# Patient Record
Sex: Female | Born: 1965 | Race: White | Hispanic: No | Marital: Single | Smoking: Current every day smoker
Health system: Southern US, Community
[De-identification: ages and names within clinical notes are randomized; demographics above are authoritative.]

## PROBLEM LIST (undated history)

## (undated) DIAGNOSIS — E785 Hyperlipidemia, unspecified: Secondary | ICD-10-CM

## (undated) DIAGNOSIS — I10 Essential (primary) hypertension: Secondary | ICD-10-CM

## (undated) DIAGNOSIS — F1910 Other psychoactive substance abuse, uncomplicated: Secondary | ICD-10-CM

## (undated) DIAGNOSIS — F909 Attention-deficit hyperactivity disorder, unspecified type: Secondary | ICD-10-CM

## (undated) DIAGNOSIS — K219 Gastro-esophageal reflux disease without esophagitis: Secondary | ICD-10-CM

## (undated) HISTORY — PX: BREAST BIOPSY: SHX20

## (undated) HISTORY — DX: Gastro-esophageal reflux disease without esophagitis: K21.9

## (undated) HISTORY — DX: Hyperlipidemia, unspecified: E78.5

## (undated) HISTORY — DX: Other psychoactive substance abuse, uncomplicated: F19.10

## (undated) HISTORY — PX: ANKLE SURGERY: SHX546

## (undated) HISTORY — DX: Essential (primary) hypertension: I10

## (undated) HISTORY — DX: Attention-deficit hyperactivity disorder, unspecified type: F90.9

## (undated) HISTORY — PX: TUBAL LIGATION: SHX77

---

## 1996-06-13 HISTORY — PX: BREAST EXCISIONAL BIOPSY: SUR124

## 2010-05-02 ENCOUNTER — Emergency Department (HOSPITAL_COMMUNITY)
Admission: EM | Admit: 2010-05-02 | Discharge: 2010-05-02 | Payer: Self-pay | Source: Home / Self Care | Admitting: Emergency Medicine

## 2010-05-02 ENCOUNTER — Encounter (INDEPENDENT_AMBULATORY_CARE_PROVIDER_SITE_OTHER): Payer: Self-pay | Admitting: *Deleted

## 2010-05-14 ENCOUNTER — Encounter (INDEPENDENT_AMBULATORY_CARE_PROVIDER_SITE_OTHER): Payer: Self-pay | Admitting: *Deleted

## 2010-06-25 ENCOUNTER — Encounter (INDEPENDENT_AMBULATORY_CARE_PROVIDER_SITE_OTHER): Payer: Self-pay | Admitting: *Deleted

## 2010-06-25 ENCOUNTER — Ambulatory Visit
Admission: RE | Admit: 2010-06-25 | Discharge: 2010-06-25 | Payer: Self-pay | Source: Home / Self Care | Attending: Gastroenterology | Admitting: Gastroenterology

## 2010-06-25 DIAGNOSIS — R630 Anorexia: Secondary | ICD-10-CM | POA: Insufficient documentation

## 2010-06-25 DIAGNOSIS — R112 Nausea with vomiting, unspecified: Secondary | ICD-10-CM | POA: Insufficient documentation

## 2010-06-25 DIAGNOSIS — R109 Unspecified abdominal pain: Secondary | ICD-10-CM | POA: Insufficient documentation

## 2010-06-25 DIAGNOSIS — R141 Gas pain: Secondary | ICD-10-CM | POA: Insufficient documentation

## 2010-06-25 DIAGNOSIS — R1013 Epigastric pain: Secondary | ICD-10-CM | POA: Insufficient documentation

## 2010-06-25 DIAGNOSIS — R143 Flatulence: Secondary | ICD-10-CM

## 2010-06-25 DIAGNOSIS — R142 Eructation: Secondary | ICD-10-CM

## 2010-06-25 DIAGNOSIS — K5900 Constipation, unspecified: Secondary | ICD-10-CM | POA: Insufficient documentation

## 2010-06-25 DIAGNOSIS — K219 Gastro-esophageal reflux disease without esophagitis: Secondary | ICD-10-CM | POA: Insufficient documentation

## 2010-07-07 ENCOUNTER — Ambulatory Visit (HOSPITAL_COMMUNITY)
Admission: RE | Admit: 2010-07-07 | Discharge: 2010-07-07 | Payer: Self-pay | Source: Home / Self Care | Attending: Gastroenterology | Admitting: Gastroenterology

## 2010-07-07 ENCOUNTER — Ambulatory Visit
Admission: RE | Admit: 2010-07-07 | Discharge: 2010-07-07 | Payer: Self-pay | Source: Home / Self Care | Attending: Gastroenterology | Admitting: Gastroenterology

## 2010-07-07 ENCOUNTER — Other Ambulatory Visit: Payer: Self-pay | Admitting: Gastroenterology

## 2010-07-09 ENCOUNTER — Encounter: Payer: Self-pay | Admitting: Gastroenterology

## 2010-07-13 NOTE — Letter (Signed)
Summary: New Patient letter  [facility]  [address] [place], [place] [address]   Phone: [phone]  Fax: [phone]       05/14/2010 MRN: [id number]  Gail Daniels [address] [place], [place]  [address]  Dear Ms. Gail Daniels,  Welcome to the Gastroenterology Division at [facility].    You are scheduled to see Dr. Jarold Motto on 06/25/2010 at 8:30AM on the 3rd floor at [facility], [address]  We ask that you try to arrive at our office 15 minutes prior to your appointment time to allow for check-in.  We would like you to complete the enclosed self-administered evaluation form prior to your visit and bring it with you on the day of your appointment.  We will review it with you.  Also, please bring a complete list of all your medications or, if you prefer, bring the medication bottles and we will list them.  Please bring your insurance card so that we may make a copy of it.  If your insurance requires a referral to see a specialist, please bring your referral form from your primary care physician.  Co-payments are due at the time of your visit and may be paid by cash, check or credit card.     Your office visit will consist of a consult with your physician (includes a physical exam), any laboratory testing he/she may order, scheduling of any necessary diagnostic testing (e.g. x-ray, ultrasound, CT-scan), and scheduling of a procedure (e.g. Endoscopy, Colonoscopy) if required.  Please allow enough time on your schedule to allow for any/all of these possibilities.    If you cannot keep your appointment, please call [phone] to cancel or reschedule prior to your appointment date.  This allows [place] the opportunity to schedule an appointment for another patient in need of care.  If you do not cancel or reschedule by 5 p.m. the business day prior to your appointment date, you will be charged a $50.00 late cancellation/no-show fee.    Thank you for choosing [facility] for your medical needs.  We appreciate the opportunity to care for you.  Please visit [place] at our website  to learn more about our practice.                     Sincerely,                                                             The Gastroenterology Division

## 2010-07-15 NOTE — Procedures (Addendum)
Summary: Upper Endoscopy  Patient: Gail Daniels Note: All result statuses are Final unless otherwise noted.  Tests: (1) Upper Endoscopy (EGD)   EGD Upper Endoscopy       DONE     [facility]     [address]     [place], [place]  [address]           ENDOSCOPY PROCEDURE REPORT           PATIENT:  Gail, Daniels  MR#:  [id number]     BIRTHDATE:  [birth year], 44 yrs. old  GENDER:  female           ENDOSCOPIST:  Vania Rea. Jarold Motto, MD, [facility]     Referred by:  Rudi Heap, M.D.           PROCEDURE DATE:  07/07/2010     PROCEDURE:  EGD with biopsy, 43239, EGD with biopsy for H. pylori     43239     ASA CLASS:  Class I     INDICATIONS:  abdominal pain, nausea and vomiting NORMAL     ULTRASOUND EXAM.           MEDICATIONS:   Fentanyl 50 mcg IV, Versed 5 mg IV     TOPICAL ANESTHETIC:  Exactacain Spray           DESCRIPTION OF PROCEDURE:   After the risks benefits and     alternatives of the procedure were thoroughly explained, informed     consent was obtained.  The LB GIF-H180 [id number] endoscope was     introduced through the mouth and advanced to the second portion of     the duodenum, without limitations.  The instrument was slowly     withdrawn as the mucosa was fully examined.     <<PROCEDUREIMAGES>>           ULTRASONIC FINDINGS:  A hiatal hernia was found. -3 CM HH NOTED.NO     STRICTURE OE ESOPHAGITIS NOTED. Normal duodenal folds were noted.     The stomach was entered and closely examined. The antrum,     angularis, and lesser curvature were well visualized, including a     retroflexed view of the cardia and fundus. The stomach wall was     normally distensable. The scope passed easily through the pylorus     into the duodenum. CLO BX. DONE.  The esophagus and     gastroesophageal junction were completely normal in appearance.     Retroflexed views revealed a hiatal hernia.    The scope was then     withdrawn from the patient and the procedure completed.        COMPLICATIONS:  None           ENDOSCOPIC IMPRESSION:     1) Hiatal hernia     2) Normal duodenal folds     3) Normal stomach     4) Normal esophagus     5) A hiatal hernia     PROBABLE GERD.     RECOMMENDATIONS:     1) Anti-reflux regimen to be follow     2) Rx CLO if positive     3) continue PPI           REPEAT EXAM:  No           ______________________________     Vania Rea. Jarold Motto, MD, [facility]           CC:  n.     eSIGNED:   Vania Rea. Vinita Prentiss at 07/07/2010 11:23 AM           Gail Daniels, [id number]  Note: An exclamation mark (!) indicates a result that was not dispersed into the flowsheet. Document Creation Date: 07/07/2010 11:24 AM _______________________________________________________________________  (1) Order result status: Final Collection or observation date-time: 07/07/2010 11:17 Requested date-time:  Receipt date-time:  Reported date-time:  Referring Physician:   Ordering Physician: Sheryn Bison [phone]) Specimen Source:  Source: Launa Grill Order Number: [id number] Lab site:

## 2010-07-15 NOTE — Assessment & Plan Note (Signed)
Summary: abd pain, nausea, vomiting...as.   History of Present Illness Visit Type: Initial Consult Primary GI MD: Sheryn Bison MD FACP FAGA Primary Provider: Ernestina Penna, MD  Requesting Provider: Ernestina Penna, MD  Chief Complaint: Upper abd pain, with nausea and vomiting, constipation, dysphagia with solids, loss of appetite, bloating, and belching  History of Present Illness:   45 year old Caucasian female with a one-year history of vague epigastric abdominal pain with some reflux symptoms of belching and burping and nausea. She been on Nexium 40 mg a day for 3 months without significant improvement. She does notice some occasional solid food dysphagia. She is referred by [facility] for evaluation.  There is no history of hepatitis, pancreatitis, liver disease. She does smoke but denies ethanol or NSAID abuse. There's been no anorexia, weight loss, or systemic complaints. She does have a brother who has Crohn's disease. Patient not had previous endoscopy or colonoscopy or ultrasound exams. She otherwise is in good medical health, denies chronic medical problems. She has had a previous tubal ligation.   GI Review of Systems    Reports abdominal pain, acid reflux, belching, bloating, dysphagia with solids, heartburn, loss of appetite, nausea, and  vomiting.     Location of  Abdominal pain: upper abdomen.    Denies chest pain, dysphagia with liquids, vomiting blood, weight loss, and  weight gain.      Reports constipation.     Denies anal fissure, black tarry stools, change in bowel habit, diarrhea, diverticulosis, fecal incontinence, heme positive stool, hemorrhoids, irritable bowel syndrome, jaundice, light color stool, liver problems, rectal bleeding, and  rectal pain.    Current Medications (verified): 1)  Prevacid 30 Mg Cpdr (Lansoprazole) .... One Tablet By Mouth Once Daily 2)  Tylenol Extra Strength 500 Mg Tabs (Acetaminophen) .... As Needed 3)  Stool  Softener 250 Mg Caps (Docusate Sodium) .... As Needed  Allergies (verified): No Known Drug Allergies  Past History:  Past medical, surgical, family and social histories (including risk factors) reviewed for relevance to current acute and chronic problems.  Past Medical History: LOSS OF APPETITE (ICD-783.0) CONSTIPATION (ICD-564.00) ABDOMINAL BLOATING (ICD-787.3) ABDOMINAL PAIN, UPPER (ICD-789.09) NAUSEA AND VOMITING (ICD-787.01) GERD (ICD-530.81)  Past Surgical History: C-Section  Tubal Ligation  Family History: Reviewed history and no changes required. No FH of Colon Cancer: Family History of Colitis/Crohn's:Brother  Family History of Prostate Cancer:PGF Family History of Colon Polyps:Mother and Brother  Family History of Diabetes: MGM  Social History: Reviewed history and no changes required. 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  Alcohol Use - no Daily Caffeine Use: 4 daily  Illicit Drug Use - no Smoking Status:  current Drug Use:  no  Review of Systems       The patient complains of allergy/sinus, anxiety-new, back pain, fatigue, menstrual pain, night sweats, and swelling of feet/legs.  The patient denies anemia, arthritis/joint pain, blood in urine, breast changes/lumps, change in vision, confusion, cough, coughing up blood, depression-new, fainting, fever, headaches-new, hearing problems, heart murmur, heart rhythm changes, itching, muscle pains/cramps, nosebleeds, pregnancy symptoms, shortness of breath, skin rash, sleeping problems, sore throat, swollen lymph glands, thirst - excessive, urination - excessive, urination changes/pain, urine leakage, vision changes, and voice change.    Vital Signs:  Patient profile:   45 year old female Height:      65 inches Weight:      124 pounds BMI:     20.71 BSA:     1.62 Pulse rate:  76 / minute Pulse rhythm:   regular BP sitting:   124 / 68  (left arm) Cuff size:   regular  Vitals Entered By:  Ok Anis CMA (June 25, 2010 8:42 AM)  Physical Exam  General:  Well developed, well nourished, no acute distress.healthy appearing.   Head:  Normocephalic and atraumatic. Eyes:  PERRLA, no icterus.exam deferred to patient's ophthalmologist.   Neck:  Supple; no masses or thyromegaly. Lungs:  Clear throughout to auscultation. Heart:  Regular rate and rhythm; no murmurs, rubs,  or bruits. Abdomen:  Soft, nontender and nondistended. No masses, hepatosplenomegaly or hernias noted. Normal bowel sounds. Rectal:  deferred until time of colonoscopy.   Pulses:  Normal pulses noted. Extremities:  No clubbing, cyanosis, edema or deformities noted. Neurologic:  Alert and  oriented x4;  grossly normal neurologically. Cervical Nodes:  No significant cervical adenopathy. Psych:  Alert and cooperative. Normal mood and affect.   Impression & Recommendations:  Problem # 1:  ABDOMINAL PAIN, EPIGASTRIC (ICD-789.06) Assessment Unchanged Abdominal Pain of unexplained etiology-rule out cholelithiasis, H. pylori infection, worsening GERD or peptic ulcer disease. Ultrasonography and endoscopy have been scheduled. We will continue PPI therapy with Carafate suspension p.c. and q.h.s. Labs requested from primary care for review. Anti-reflex maneuvers explained to patient. Orders: EGD (EGD) Ultrasound Abdomen (UAS)  Problem # 2:  CONSTIPATION (ICD-564.00) Assessment: Unchanged Symptoms consistent with constipation predominant IBS. High-fiber diet with daily Benefiber and liberal p.o. fluids recommended with outpatient colonoscopy.  Problem # 3:  ABDOMINAL BLOATING (ICD-787.3) Assessment: Unchanged  Problem # 4:  NAUSEA AND VOMITING (ICD-787.01) Assessment: Improved  Patient Instructions: 1)  Copy sent to : Ernestina Penna, MD  2)  Take Nexium every morning and Pepcid AC every night. 3)  Avoid foods high in acid content ( tomatoes, citrus juices, spicy foods) . Avoid eating within 3 to 4 hours of  lying down or before exercising. Do not over eat; try smaller more frequent meals. Elevate head of bed four inches when sleeping.  4)  GI Reflux brochure given.  5)  [facility] Patient Information Guide given to patient.  6)  Upper Endoscopy brochure given.  7)  Your procedure has been scheduled for 07/07/2010, please follow the seperate instructions.  8)  Your prescription(s) have been sent to you pharmacy.  9)  The medication list was reviewed and reconciled.  All changed / newly prescribed medications were explained.  A complete medication list was provided to the patient / caregiver. Prescriptions: PEPCID AC MAXIMUM STRENGTH 20 MG TABS (FAMOTIDINE) Take one by mouth at bedtime  #30 x 6   Entered by:   Harlow Mares CMA (AAMA)   Authorized by:   Mardella Layman MD [facility]   Signed by:   Harlow Mares CMA (AAMA) on 06/25/2010   Method used:   Faxed to ...       [address] (retail)       [address]       [place], [place]  [address]  [place]       Ph: [phone]       Fax: [phone]   RxID:   [id number] NEXIUM 40 MG CPDR (ESOMEPRAZOLE MAGNESIUM) take one by mouth every morning.  #30 x 6   Entered by:   Harlow Mares CMA (AAMA)   Authorized by:   Mardella Layman MD [facility]   Signed by:   Harlow Mares CMA (AAMA) on 06/25/2010   Method used:   Arneta Cliche  to ...       [address] (retail)       [address]       [place], [place]  [address]  [place]       Ph: [phone]       Fax: [phone]   RxID:   [id number]

## 2010-07-15 NOTE — Letter (Signed)
Summary: EGD Instructions  [facility]  [address] [place], [place] [address]   Phone: [phone]  Fax: [phone]       Gail Daniels    03-21-1966    MRN: [id number]       Procedure Day [name]: Wednesday 07/07/2010     Arrival Time: 10:30am     Procedure Time: 11:30am     Location of Procedure:                    [facility] (4th Floor)   PREPARATION FOR ENDOSCOPY  On 07/07/2010 THE DAY OF THE PROCEDURE:  1.   No solid foods, milk or milk products are allowed after midnight the night before your procedure.  2.   Do not drink anything colored red or purple.  Avoid juices with pulp.  No orange juice.  3.  You may drink clear liquids until 9:30am, which is 2 hours before your procedure.                                                                                                CLEAR LIQUIDS INCLUDE: Water Jello Ice Popsicles Tea (sugar ok, no milk/cream) Powdered fruit flavored drinks Coffee (sugar ok, no milk/cream) Gatorade Juice: apple, white grape, white cranberry  Lemonade Clear bullion, consomm, broth Carbonated beverages (any kind) Strained chicken noodle soup Hard Candy   MEDICATION INSTRUCTIONS  Unless otherwise instructed, you should take regular prescription medications with a small sip of water as early as possible the morning of your procedure.                OTHER INSTRUCTIONS  You will need a responsible adult at least 45 years of age to accompany you and drive you home.   This person must remain in the waiting room during your procedure.  Wear loose fitting clothing that is easily removed.  Leave jewelry and other valuables at home.  However, you may wish to bring a book to read or an iPod/MP3 player to listen to music as you wait for your procedure to start.  Remove all body piercing jewelry and leave at home.  Total time from sign-in until discharge is approximately 2-3 hours.  You should go home  directly after your procedure and rest.  You can resume normal activities the day after your procedure.  The day of your procedure you should not:   Drive   Make legal decisions   Operate machinery   Drink alcohol   Return to work  You will receive specific instructions about eating, activities and medications before you leave.    The above instructions have been reviewed and explained to me by   _______________________    I fully understand and can verbalize these instructions _____________________________ Date _________

## 2010-07-15 NOTE — Miscellaneous (Signed)
Summary: clotest  Clinical Lists Changes  Orders: Added new Test order of TLB-H Pylori Screen Gastric Biopsy (83013-CLOTEST) - Signed 

## 2010-07-21 NOTE — Letter (Signed)
Summary: Patient Notice-Endo Biopsy Results  [facility]  [address] [place], [place] [address]   Phone: [phone]  Fax: [phone]        July 09, 2010 MRN: [id number]    Britni Guo [address] [place], [place]  [address]    Dear Ms. Jenean Lindau,  I am pleased to inform you that the biopsies taken during your recent endoscopic examination did not show any evidence of cancer upon pathologic examination.  Additional information/recommendations:  __No further action is needed at this time.  Please follow-up with      your primary care physician for your other healthcare needs.  __ Please call [phone] to schedule a return visit to review      your condition.  _X_ Continue with the treatment plan as outlined on the day of your      exam.BIOPSIES FOR H.PYLORI WERE NEGSTIVE.  __ You should have a repeat endoscopic examination for this problem              in _ months/years.   Please call us if you are having persistent problems or have questions about your condition that have not been fully answered at this time.  Sincerely,  Mardella Layman MD [facility]  This letter has been electronically signed by your physician.  Appended Document: Patient Notice-Endo Biopsy Results LETTER MAILED

## 2010-08-24 LAB — COMPREHENSIVE METABOLIC PANEL
BUN: 9 mg/dL (ref 6–23)
CO2: 23 mEq/L (ref 19–32)
Calcium: 9.1 mg/dL (ref 8.4–10.5)
Creatinine, Ser: 1.18 mg/dL (ref 0.4–1.2)
GFR calc Af Amer: >60 mL/min (ref >60)
GFR calc non Af Amer: 50 mL/min — ABNORMAL LOW (ref >60)
Glucose, Bld: 93 mg/dL (ref 70–99)
Total Bilirubin: 0.7 mg/dL (ref 0.3–1.2)

## 2010-08-24 LAB — CBC
HCT: 38.7 % (ref 36.0–46.0)
Hemoglobin: 13.4 g/dL (ref 12.0–15.0)
MCH: 30.4 pg (ref 26.0–34.0)
MCHC: 34.6 g/dL (ref 30.0–36.0)
MCV: 87.8 fL (ref 78.0–100.0)

## 2010-08-24 LAB — DIFFERENTIAL
Basophils Absolute: 0 10*3/uL (ref 0.0–0.1)
Lymphocytes Relative: 42 % (ref 12–46)
Lymphs Abs: 2.5 10*3/uL (ref 0.7–4.0)
Neutrophils Relative %: 51 % (ref 43–77)

## 2010-08-24 LAB — LIPASE, BLOOD: Lipase: 26 U/L (ref 11–59)

## 2011-12-28 IMAGING — CR DG ABDOMEN ACUTE W/ 1V CHEST
3 series · 3 of 3 positions shown · non-contrast
Comparison: None.

CLINICAL DATA: Abdominal pain

ACUTE ABDOMEN SERIES (ABDOMEN 2 VIEW & CHEST 1 VIEW)

[w chest pa]
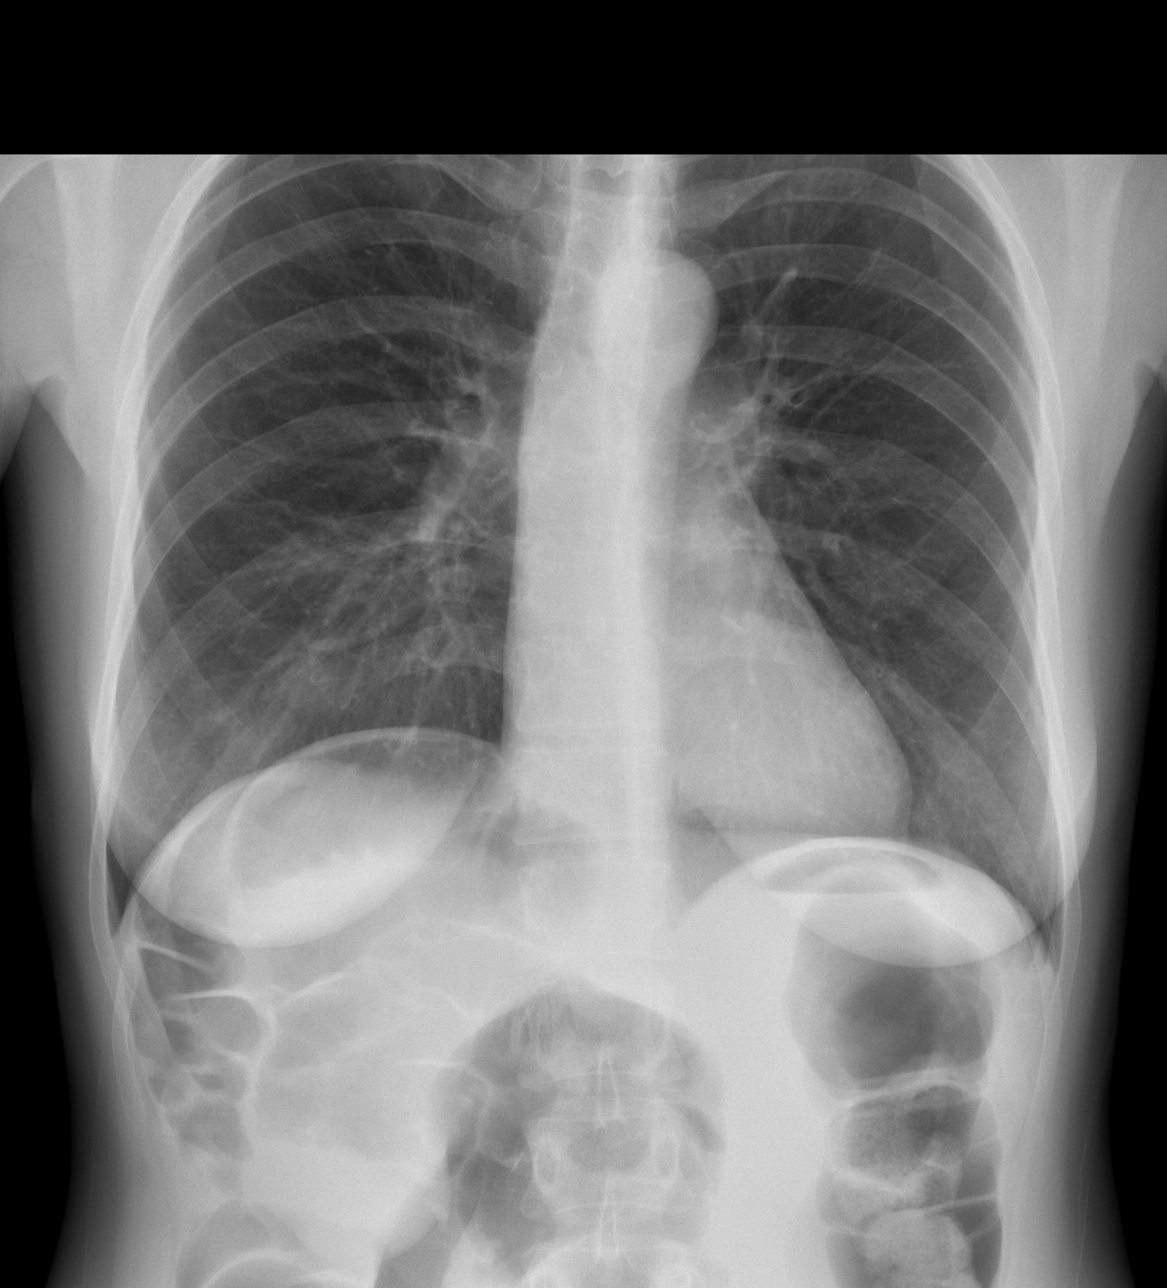

[w abdomen upright]
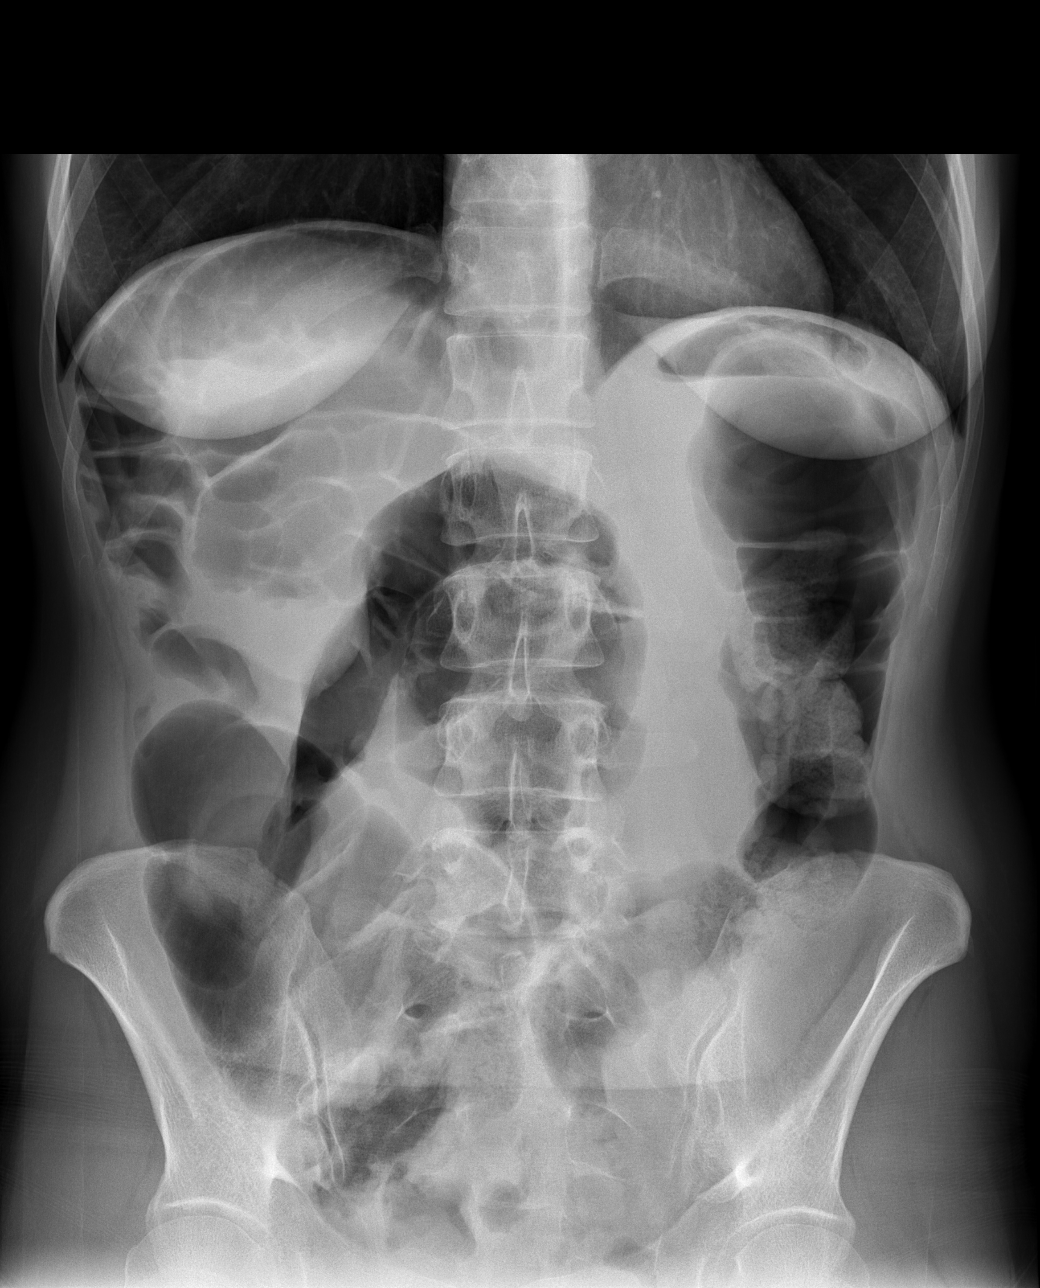

[t abdomen supine]
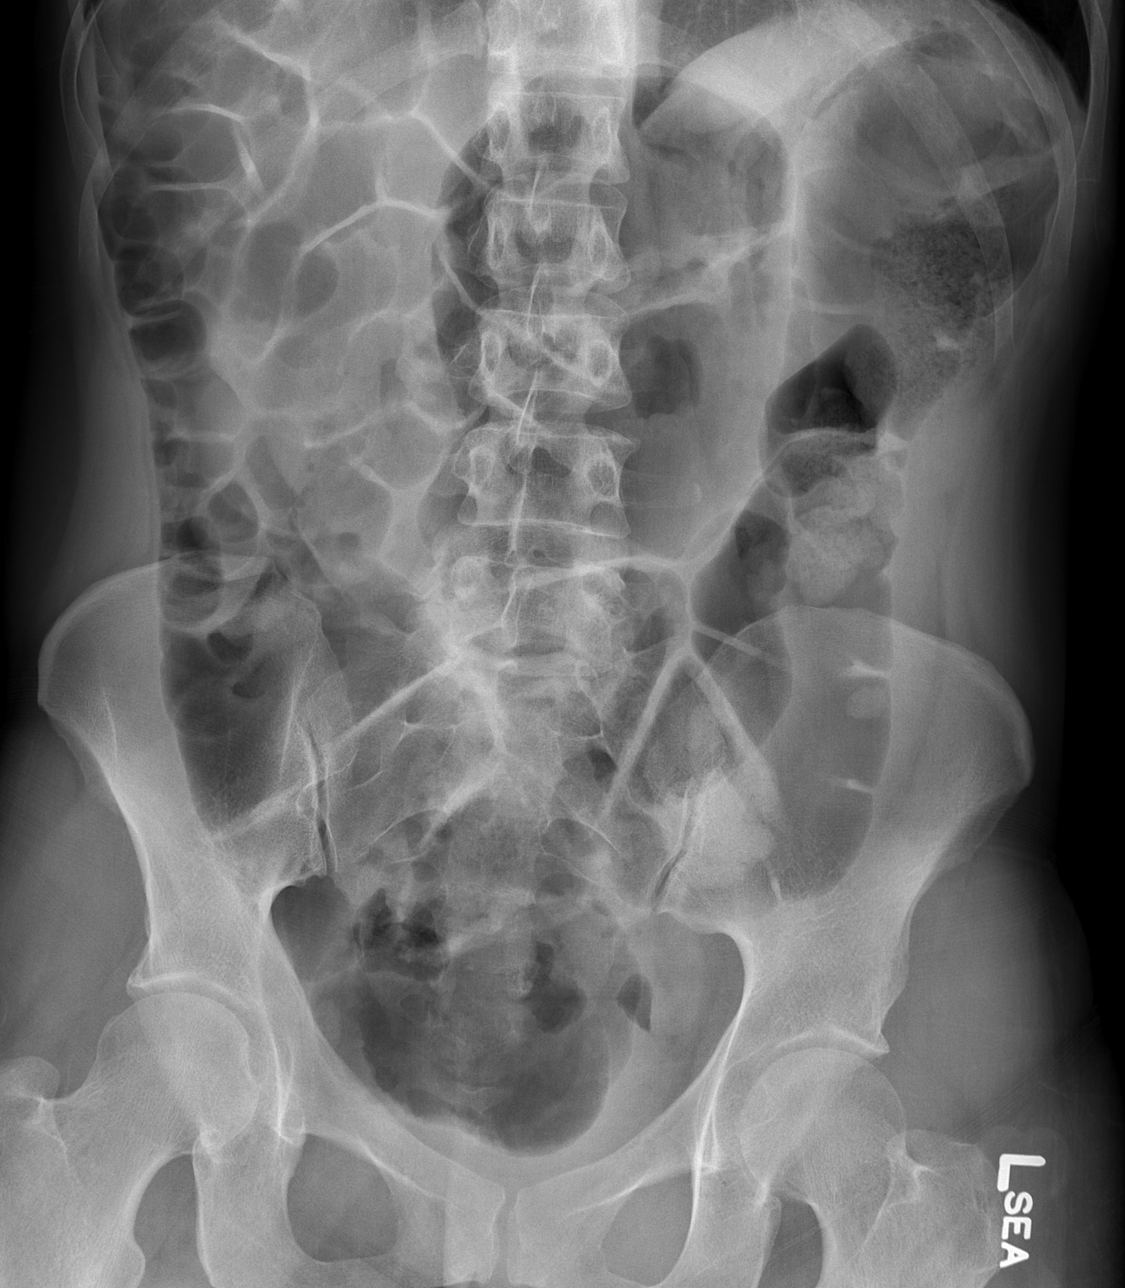

[3 of 3 positions shown; findings below may reference images not displayed]

FINDINGS: Cardiomediastinal silhouette is unremarkable.  No acute
infiltrate or pleural effusion.  Mild gaseous distended small bowel
loops are noted in the right upper abdomen.  There is colonic
distention with gas and stool without definite evidence of colonic
obstruction.  No free abdominal air.
IMPRESSION: No acute disease within chest.  Mild gaseous distended small bowel
loops in the right upper abdomen probable mild ileus.  Significant
colonic distention with gas and stool without evidence of colonic
obstruction.

## 2012-01-15 ENCOUNTER — Encounter (HOSPITAL_COMMUNITY): Payer: Self-pay | Admitting: *Deleted

## 2012-01-15 ENCOUNTER — Emergency Department (HOSPITAL_COMMUNITY): Payer: Medicaid Other

## 2012-01-15 ENCOUNTER — Emergency Department (HOSPITAL_COMMUNITY)
Admission: EM | Admit: 2012-01-15 | Discharge: 2012-01-15 | Disposition: A | Discharge status: Other Acute Inpatient Hospital | Payer: Medicaid Other | Attending: Emergency Medicine | Admitting: Emergency Medicine

## 2012-01-15 DIAGNOSIS — R221 Localized swelling, mass and lump, neck: Secondary | ICD-10-CM | POA: Insufficient documentation

## 2012-01-15 DIAGNOSIS — F17200 Nicotine dependence, unspecified, uncomplicated: Secondary | ICD-10-CM | POA: Insufficient documentation

## 2012-01-15 DIAGNOSIS — R220 Localized swelling, mass and lump, head: Secondary | ICD-10-CM | POA: Insufficient documentation

## 2012-01-15 DIAGNOSIS — K047 Periapical abscess without sinus: Secondary | ICD-10-CM | POA: Insufficient documentation

## 2012-01-15 DIAGNOSIS — R51 Headache: Secondary | ICD-10-CM | POA: Insufficient documentation

## 2012-01-15 LAB — CBC WITH DIFFERENTIAL/PLATELET
Basophils Absolute: 0 10*3/uL (ref 0.0–0.1)
HCT: 38.8 % (ref 36.0–46.0)
Hemoglobin: 13.4 g/dL (ref 12.0–15.0)
Lymphocytes Relative: 11 % — ABNORMAL LOW (ref 12–46)
Monocytes Absolute: 0.6 10*3/uL (ref 0.1–1.0)
Monocytes Relative: 7 % (ref 3–12)
Neutro Abs: 7.4 10*3/uL (ref 1.7–7.7)
Neutrophils Relative %: 82 % — ABNORMAL HIGH (ref 43–77)
RDW: 13 % (ref 11.5–15.5)
WBC: 9 10*3/uL (ref 4.0–10.5)

## 2012-01-15 LAB — BASIC METABOLIC PANEL
CO2: 25 mEq/L (ref 19–32)
Chloride: 104 mEq/L (ref 96–112)
Creatinine, Ser: 1.01 mg/dL (ref 0.50–1.10)
GFR calc Af Amer: 76 mL/min — ABNORMAL LOW (ref >90)
Potassium: 3.6 mEq/L (ref 3.5–5.1)

## 2012-01-15 MED ORDER — HYDROMORPHONE HCL PF 1 MG/ML IJ SOLN
1.0000 mg | Freq: Once | INTRAMUSCULAR | Status: AC
  Administered 2012-01-15: 1 mg via INTRAVENOUS
  Filled 2012-01-15: qty 1

## 2012-01-15 MED ORDER — OXYCODONE-ACETAMINOPHEN 5-325 MG PO TABS
2.0000 | ORAL_TABLET | Freq: Once | ORAL | Status: AC
  Administered 2012-01-15: 2 via ORAL
  Filled 2012-01-15: qty 2

## 2012-01-15 MED ORDER — SODIUM CHLORIDE 0.9 % IV SOLN
3.0000 g | Freq: Once | INTRAVENOUS | Status: AC
  Administered 2012-01-15: 3 g via INTRAVENOUS
  Filled 2012-01-15: qty 3

## 2012-01-15 MED ORDER — ONDANSETRON HCL 4 MG/2ML IJ SOLN
INTRAMUSCULAR | Status: AC
  Administered 2012-01-15: 4 mg
  Filled 2012-01-15: qty 2

## 2012-01-15 MED ORDER — IOHEXOL 300 MG/ML  SOLN
75.0000 mL | Freq: Once | INTRAMUSCULAR | Status: AC | PRN
  Administered 2012-01-15: 75 mL via INTRAVENOUS

## 2012-01-15 NOTE — ED Provider Notes (Signed)
History  This chart was scribed for Joya Gaskins, MD by Erskine Emery. This patient was seen in room APA19/APA19 and the patient's care was started at 9:44.   CSN: [id number]  Arrival date & time 01/15/12  0911   First MD Initiated Contact with Patient 01/15/12 [phone]      Chief Complaint  Patient presents with  . Abscess     HPI Gail Daniels is a 46 y.o. female who presents to the Emergency Department complaining of a severely painful oral abscess and right-sided facial swelling with associated chills since last night. Pt reports she saw a dentist (Dr. Jarold Motto) in [place] a few weeks ago; she was given antibiotics which she finished and had no worsening issues until last night. Pt denies any trouble swallowing, fevers, vomiting, chest pain, or abdominal pain. Pt has no known allergies, no h/o DM, and is not on anticoagulant medications.  Pt reports pain worsen with palpation, improved with rest  PMH - none  Past Surgical History  Procedure Date  . Cesarean section   . Tubal ligation     History reviewed. No pertinent family history.  History  Substance Use Topics  . Smoking status: Current Everyday Smoker    Types: Cigarettes  . Smokeless tobacco: Not on file  . Alcohol Use: Yes    OB History    Grav Para Term Preterm Abortions TAB SAB Ect Mult Living                  Review of Systems A complete 10 system review of systems was obtained and all systems are negative except as noted in the HPI and PMH.    Allergies  Review of patient's allergies indicates no known allergies.  Home Medications  No current outpatient prescriptions on file.  Triage Vitals: BP 143/86  Pulse 79  Temp 98.4 F (36.9 C) (Oral)  Resp 20  Ht 5\' 5"  (1.651 m)  Wt 120 lb (54.432 kg)  BMI 19.97 kg/m2  SpO2 100%  Physical Exam CONSTITUTIONAL: Well developed/well nourished HEAD AND FACE: Normocephalic/atraumatic EYES: EOMI/PERRL ENMT: Mucous membranes moist.  Facial swelling  noted to encompass right mandibular region into upper neck. No overlying erythema or abscess.  No trismus.  Neck is supple and not brawny to touch.  No stridor.  She has poor dentition.  Tender in right mandibular gingiva. NECK: supple no meningeal signs SPINE:entire spine nontender CV: S1/S2 noted, no murmurs/rubs/gallops noted LUNGS: Lungs are clear to auscultation bilaterally, no apparent distress ABDOMEN: soft, nontender, no rebound or guarding GU:no cva tenderness NEURO: Pt is awake/alert, moves all extremitiesx4 EXTREMITIES: pulses normal, full ROM SKIN: warm, color normal PSYCH: anxious   ED Course  Procedures DIAGNOSTIC STUDIES: Oxygen Saturation is 100% on room air, normal by my interpretation.    COORDINATION OF CARE: 9:51--I evaluated the patient and we discussed a treatment plan including IV pain medication, CT scan, antibiotics, and possible transfer to [facility] for oral surgery to which the pt agreed. I told the pt that she cannot have anything to eat or drink.   There is no current oral surgeon on call for [place]  10:17--I rechecked the pt and notified her that the only available place for transfer is [facility] in [place].   11:35--I rechecked the pt and thanked her for her patience.   11:50 AM D/w dr Valetta Mole with oral surgery at [facility] We discussed Ct imaging He recommends IV antibiotics, start clindamycin 300mg  QID Can f/u tomorrow with oral surgery  or dentist   1:02 PM Pt with continued pain and she reports difficulty swallowing Spoke with dr Valetta Mole with oral surgery We agreed that she can be transferred to [facility] ER to ER transfer, and while in the ED can be seen by dentistry or ENT I spoke to Dr Paulina Fusi, ED physician, accepts in transfer Pt stable at this time, protecting airway, no trismus and neck supple.  She is protecting airway Ct imaging will be sent with patient    MDM  Nursing notes including past medical history and social  history reviewed and considered in documentation Labs/vital reviewed and considered       I personally performed the services described in this documentation, which was scribed in my presence. The recorded information has been reviewed and considered.      Joya Gaskins, MD 01/15/12 [phone]

## 2012-01-15 NOTE — ED Notes (Signed)
Pt had great difficulty in swallowing meds and water. States she does not want to go home because she is scared.

## 2012-01-15 NOTE — ED Notes (Signed)
Report given to Minerva Ends, Press photographer at [employer]

## 2012-01-15 NOTE — ED Notes (Signed)
Pt states she will vomit when given dilaudid. edp aware and zofran given prior to dilaudid given.

## 2012-09-07 ENCOUNTER — Telehealth: Payer: Self-pay | Admitting: Nurse Practitioner

## 2012-09-10 NOTE — Telephone Encounter (Signed)
Painful axillary abscess.  Starting to form a "head."  Instructed patient to apply a warm compress several times a day to help it drain.  Do not squeeze or puncture the area.  Appt scheduled for tomorrow morning with Bennie Pierini, FNP.  Patient stated understanding and agreed to plan.

## 2012-09-11 ENCOUNTER — Encounter: Payer: Self-pay | Admitting: Nurse Practitioner

## 2012-09-11 ENCOUNTER — Ambulatory Visit (INDEPENDENT_AMBULATORY_CARE_PROVIDER_SITE_OTHER): Payer: Medicaid Other | Admitting: Nurse Practitioner

## 2012-09-11 VITALS — BP 126/80 | HR 78 | Temp 97.6°F | Ht 65.5 in | Wt 127.0 lb

## 2012-09-11 DIAGNOSIS — L732 Hidradenitis suppurativa: Secondary | ICD-10-CM

## 2012-09-11 DIAGNOSIS — K219 Gastro-esophageal reflux disease without esophagitis: Secondary | ICD-10-CM

## 2012-09-11 DIAGNOSIS — K029 Dental caries, unspecified: Secondary | ICD-10-CM

## 2012-09-11 MED ORDER — OMEPRAZOLE 40 MG PO CPDR: 40.0000 mg | DELAYED_RELEASE_CAPSULE | Freq: Every day | ORAL | Status: DC

## 2012-09-11 MED ORDER — CLINDAMYCIN HCL 300 MG PO CAPS: 300.0000 mg | ORAL_CAPSULE | Freq: Four times a day (QID) | ORAL | Status: DC

## 2012-09-11 MED ORDER — TRAMADOL HCL 50 MG PO TABS: 50.0000 mg | ORAL_TABLET | Freq: Four times a day (QID) | ORAL | Status: DC | PRN

## 2012-09-11 NOTE — Patient Instructions (Signed)
Diet for Gastroesophageal Reflux Disease, Adult  Reflux (acid reflux) is when acid from your stomach flows up into the esophagus. When acid comes in contact with the esophagus, the acid causes irritation and soreness (inflammation) in the esophagus. When reflux happens often or so severely that it causes damage to the esophagus, it is called gastroesophageal reflux disease (GERD). Nutrition therapy can help ease the discomfort of GERD.  FOODS OR DRINKS TO AVOID OR LIMIT   Smoking or chewing tobacco. Nicotine is one of the most potent stimulants to acid production in the gastrointestinal tract.   Caffeinated and decaffeinated coffee and black tea.   Regular or low-calorie carbonated beverages or energy drinks (caffeine-free carbonated beverages are allowed).    Strong spices, such as black pepper, white pepper, red pepper, cayenne, curry powder, and chili powder.   Peppermint or spearmint.   Chocolate.   High-fat foods, including meats and fried foods. Extra added fats including oils, butter, salad dressings, and nuts. Limit these to less than 8 tsp per day.   Fruits and vegetables if they are not tolerated, such as citrus fruits or tomatoes.   Alcohol.   Any food that seems to aggravate your condition.  If you have questions regarding your diet, call your caregiver or a registered dietitian.  OTHER THINGS THAT MAY HELP GERD INCLUDE:    Eating your meals slowly, in a relaxed setting.   Eating 5 to 6 small meals per day instead of 3 large meals.   Eliminating food for a period of time if it causes distress.   Not lying down until 3 hours after eating a meal.   Keeping the head of your bed raised 6 to 9 inches (15 to 23 cm) by using a foam wedge or blocks under the legs of the bed. Lying flat may make symptoms worse.   Being physically active. Weight loss may be helpful in reducing reflux in overweight or obese adults.   Wear loose fitting clothing  EXAMPLE MEAL PLAN  This meal plan is approximately  2,000 calories based on [employer]My[employer].gov meal planning guidelines.  Breakfast    cup cooked oatmeal.   1 cup strawberries.   1 cup low-fat milk.   1 oz almonds.  Snack   1 cup cucumber slices.   6 oz yogurt (made from low-fat or fat-free milk).  Lunch   2 slice whole-wheat bread.   2 oz sliced turkey.   2 tsp mayonnaise.   1 cup blueberries.   1 cup snap peas.  Snack   6 whole-wheat crackers.   1 oz string cheese.  Dinner    cup brown rice.   1 cup mixed veggies.   1 tsp olive oil.   3 oz grilled fish.  Document Released: 05/30/2005 Document Revised: 08/22/2011 Document Reviewed: 04/15/2011  Exit[employer] Patient Information 2013 [employer].

## 2012-09-11 NOTE — Addendum Note (Signed)
Addended by: Bennie Pierini on: 09/11/2012 09:37 AM   Modules accepted: Orders

## 2012-09-11 NOTE — Progress Notes (Signed)
  Subjective:    Patient ID: Gail Daniels, female    DOB: [birth year], 47 y.o.   MRN: [id number]  HPIPatient in c/o abscess in left axillia. Stared 1 week age. Has gotten bigger, red and tender to touch. No drainage. Patient also has an abscess tooth. Has dentist appointment [facility] [phone]. Cant be seen any sooner at dentist. GERD Currently on nexium 40 mg but seems to wear off to early and has symptoms late in evening. Nexium is alo very expensive. Would like to try something different.    Review of Systems  Constitutional: Negative.   HENT: Positive for dental problem (Right upper).   Eyes: Negative.   Respiratory: Negative.   Cardiovascular: Negative.   Gastrointestinal: Negative.   Genitourinary: Negative.        Objective:   Physical Exam  Constitutional: She is oriented to person, place, and time. She appears well-developed and well-nourished.  HENT:  Mouth/Throat:    Dental cavity right upper canine   Neck: Normal range of motion. Neck supple.  Cardiovascular: Normal rate, normal heart sounds and intact distal pulses.   Pulmonary/Chest: Effort normal and breath sounds normal.  Abdominal: Soft. Bowel sounds are normal. She exhibits no mass. There is no tenderness. There is no guarding.  Neurological: She is alert and oriented to person, place, and time.  Skin: Skin is warm and dry.  3cm erythematous tender lesion left axillary area   BP 126/80  Pulse 78  Temp(Src) 97.6 F (36.4 C) (Oral)  Ht 5' 5.5" (1.664 m)  Wt 127 lb (57.607 kg)  BMI 20.81 kg/m2  Procedure Betadine prep to left axilla Lidocaine 2% with epi 2cc local #15 blade to make incision Copious amounts of yellowish excudate Betadine Dressing applied         Assessment & Plan:  1. I/D left hydradenitis  Keep clean and dry  Warm soaks daily  Antibiotic as RX for dental abscess 2. Dental Caries  Cleocin as Rx  Keep appointment with dentist 3. Genella Rife  Stop nexium  Omeprazole 40 mg 1 PO  BID  Avoid spicy and fatty foods   Mary-Margaret Daphine Deutscher, FNP

## 2012-09-13 LAB — WOUND CULTURE

## 2012-09-20 ENCOUNTER — Ambulatory Visit: Payer: Self-pay | Admitting: Nurse Practitioner

## 2012-09-21 ENCOUNTER — Telehealth: Payer: Self-pay | Admitting: *Deleted

## 2012-09-25 ENCOUNTER — Telehealth: Payer: Self-pay | Admitting: *Deleted

## 2012-09-25 NOTE — Telephone Encounter (Signed)
No response from patient after numerous calls.  Unable to leave message.

## 2012-09-25 NOTE — Telephone Encounter (Signed)
Unable to get a response from patient.

## 2012-09-25 NOTE — Telephone Encounter (Signed)
Unable to leave message and no return call from patient.

## 2012-10-01 ENCOUNTER — Encounter: Payer: Self-pay | Admitting: *Deleted

## 2012-10-01 ENCOUNTER — Other Ambulatory Visit: Payer: Self-pay | Admitting: *Deleted

## 2012-10-01 MED ORDER — AMPHETAMINE-DEXTROAMPHETAMINE 20 MG PO TABS: 20.0000 mg | ORAL_TABLET | Freq: Three times a day (TID) | ORAL | Status: DC

## 2012-10-01 NOTE — Telephone Encounter (Signed)
rx given to patient

## 2012-10-01 NOTE — Telephone Encounter (Signed)
Last seen 05/08/12. If approved call pt at [phone]

## 2012-10-29 ENCOUNTER — Other Ambulatory Visit: Payer: Self-pay

## 2012-10-29 MED ORDER — AMPHETAMINE-DEXTROAMPHETAMINE 20 MG PO TABS: 20.0000 mg | ORAL_TABLET | Freq: Three times a day (TID) | ORAL | Status: DC

## 2012-10-29 NOTE — Telephone Encounter (Signed)
rx ready for pickup 

## 2012-10-29 NOTE — Telephone Encounter (Signed)
lmom 

## 2012-10-29 NOTE — Telephone Encounter (Signed)
Last filled 10/01/12  Last seen 09/11/12  Print and have nurse call patient to pick up

## 2012-12-06 ENCOUNTER — Telehealth: Payer: Self-pay | Admitting: Nurse Practitioner

## 2012-12-06 MED ORDER — AMPHETAMINE-DEXTROAMPHETAMINE 20 MG PO TABS: 20.0000 mg | ORAL_TABLET | Freq: Three times a day (TID) | ORAL | Status: DC

## 2012-12-06 NOTE — Telephone Encounter (Signed)
rx ready for pickup 

## 2012-12-06 NOTE — Telephone Encounter (Signed)
Patient aware rx up front 

## 2012-12-06 NOTE — Telephone Encounter (Signed)
Please let pt know rx ready for pick up.

## 2013-01-01 ENCOUNTER — Other Ambulatory Visit: Payer: Self-pay | Admitting: *Deleted

## 2013-01-01 MED ORDER — AMPHETAMINE-DEXTROAMPHETAMINE 20 MG PO TABS: 20.0000 mg | ORAL_TABLET | Freq: Three times a day (TID) | ORAL | Status: DC

## 2013-01-01 NOTE — Telephone Encounter (Signed)
Patient last seen on 09-11-12 by MMM. Rx last filled on 12-06-12. Please advise

## 2013-01-10 ENCOUNTER — Ambulatory Visit: Payer: Medicaid Other | Admitting: Nurse Practitioner

## 2013-01-29 ENCOUNTER — Other Ambulatory Visit: Payer: Self-pay

## 2013-01-29 MED ORDER — AMPHETAMINE-DEXTROAMPHETAMINE 20 MG PO TABS: 20.0000 mg | ORAL_TABLET | Freq: Three times a day (TID) | ORAL | Status: DC

## 2013-01-29 NOTE — Telephone Encounter (Signed)
rx ready for pickup 

## 2013-01-29 NOTE — Telephone Encounter (Signed)
Last seen 09/11/12  MMM  Last filled 01/01/13  If approved print and route to nurse

## 2013-01-30 NOTE — Telephone Encounter (Signed)
Up front no answer 

## 2013-02-28 ENCOUNTER — Telehealth: Payer: Self-pay | Admitting: Nurse Practitioner

## 2013-03-01 ENCOUNTER — Other Ambulatory Visit: Payer: Self-pay | Admitting: *Deleted

## 2013-03-01 MED ORDER — AMPHETAMINE-DEXTROAMPHETAMINE 20 MG PO TABS: 20.0000 mg | ORAL_TABLET | Freq: Three times a day (TID) | ORAL | Status: DC

## 2013-03-01 NOTE — Telephone Encounter (Signed)
Last filled 01/29/13, last seen 09/11/12, will print

## 2013-03-01 NOTE — Telephone Encounter (Signed)
rx ready for pickup 

## 2013-03-04 NOTE — Telephone Encounter (Signed)
Patient aware to pick up 

## 2013-04-08 ENCOUNTER — Other Ambulatory Visit: Payer: Self-pay | Admitting: *Deleted

## 2013-04-08 MED ORDER — AMPHETAMINE-DEXTROAMPHETAMINE 20 MG PO TABS: 20.0000 mg | ORAL_TABLET | Freq: Three times a day (TID) | ORAL | Status: DC

## 2013-04-08 NOTE — Telephone Encounter (Signed)
Pt picked up rx

## 2013-04-08 NOTE — Telephone Encounter (Signed)
Old note- this has been done

## 2013-04-08 NOTE — Telephone Encounter (Signed)
Last filled 03/01/13, last seen 04/14. Rx will print, pt is here

## 2013-04-08 NOTE — Telephone Encounter (Signed)
rx ready for pickup 

## 2013-05-08 ENCOUNTER — Telehealth: Payer: Self-pay | Admitting: Family Medicine

## 2013-05-08 MED ORDER — AMPHETAMINE-DEXTROAMPHETAMINE 20 MG PO TABS: 20.0000 mg | ORAL_TABLET | Freq: Three times a day (TID) | ORAL | Status: DC

## 2013-05-08 NOTE — Telephone Encounter (Signed)
Needs her addrall it is due tom and we are closed patient is her waiting

## 2013-06-03 ENCOUNTER — Telehealth: Payer: Self-pay | Admitting: Nurse Practitioner

## 2013-06-03 MED ORDER — AMPHETAMINE-DEXTROAMPHETAMINE 20 MG PO TABS: 20.0000 mg | ORAL_TABLET | Freq: Three times a day (TID) | ORAL | Status: DC

## 2013-06-03 NOTE — Telephone Encounter (Signed)
rx ready for pickup 

## 2013-06-03 NOTE — Telephone Encounter (Signed)
PATIENT AWARE TO PICK UP

## 2013-07-04 ENCOUNTER — Telehealth: Payer: Self-pay | Admitting: Nurse Practitioner

## 2013-07-04 MED ORDER — AMPHETAMINE-DEXTROAMPHETAMINE 20 MG PO TABS: 20.0000 mg | ORAL_TABLET | Freq: Three times a day (TID) | ORAL | Status: DC

## 2013-07-04 NOTE — Telephone Encounter (Signed)
rx ready for pickup 

## 2013-07-04 NOTE — Telephone Encounter (Signed)
Patient aware rx up front 

## 2013-07-31 ENCOUNTER — Telehealth: Payer: Self-pay | Admitting: Nurse Practitioner

## 2013-08-01 NOTE — Telephone Encounter (Signed)
Cannot do refill has not been seen since last april

## 2013-08-02 NOTE — Telephone Encounter (Signed)
Patient aware will schedule patient

## 2013-08-23 ENCOUNTER — Ambulatory Visit (INDEPENDENT_AMBULATORY_CARE_PROVIDER_SITE_OTHER): Payer: Medicaid Other | Admitting: Nurse Practitioner

## 2013-08-23 ENCOUNTER — Encounter: Payer: Self-pay | Admitting: Nurse Practitioner

## 2013-08-23 VITALS — BP 144/87 | HR 66 | Temp 97.0°F | Ht 65.0 in | Wt 130.0 lb

## 2013-08-23 DIAGNOSIS — F988 Other specified behavioral and emotional disorders with onset usually occurring in childhood and adolescence: Secondary | ICD-10-CM | POA: Insufficient documentation

## 2013-08-23 DIAGNOSIS — K219 Gastro-esophageal reflux disease without esophagitis: Secondary | ICD-10-CM

## 2013-08-23 MED ORDER — AMPHETAMINE-DEXTROAMPHETAMINE 20 MG PO TABS: 20.0000 mg | ORAL_TABLET | Freq: Two times a day (BID) | ORAL | Status: DC

## 2013-08-23 MED ORDER — AMPHETAMINE-DEXTROAMPHETAMINE 20 MG PO TABS: 20.0000 mg | ORAL_TABLET | Freq: Three times a day (TID) | ORAL | Status: DC

## 2013-08-23 MED ORDER — OMEPRAZOLE 40 MG PO CPDR: 40.0000 mg | DELAYED_RELEASE_CAPSULE | Freq: Every day | ORAL | Status: DC

## 2013-08-23 NOTE — Patient Instructions (Signed)
Stress Management Stress is a state of physical or mental tension that often results from changes in your life or normal routine. Some common causes of stress are:  Death of a loved one.  Injuries or severe illnesses.  Getting fired or changing jobs.  Moving into a new home. Other causes may be:  Sexual problems.  Business or financial losses.  Taking on a large debt.  Regular conflict with someone at home or at work.  Constant tiredness from lack of sleep. It is not just bad things that are stressful. It may be stressful to:  Win the lottery.  Get married.  Buy a new car. The amount of stress that can be easily tolerated varies from person to person. Changes generally cause stress, regardless of the types of change. Too much stress can affect your health. It may lead to physical or emotional problems. Too little stress (boredom) may also become stressful. SUGGESTIONS TO REDUCE STRESS:  Talk things over with your family and friends. It often is helpful to share your concerns and worries. If you feel your problem is serious, you may want to get help from a professional counselor.  Consider your problems one at a time instead of lumping them all together. Trying to take care of everything at once may seem impossible. List all the things you need to do and then start with the most important one. Set a goal to accomplish 2 or 3 things each day. If you expect to do too many in a single day you will naturally fail, causing you to feel even more stressed.  Do not use alcohol or drugs to relieve stress. Although you may feel better for a short time, they do not remove the problems that caused the stress. They can also be habit forming.  Exercise regularly - at least 3 times per week. Physical exercise can help to relieve that "uptight" feeling and will relax you.  The shortest distance between despair and hope is often a good night's sleep.  Go to bed and get up on time allowing  yourself time for appointments without being rushed.  Take a short "time-out" period from any stressful situation that occurs during the day. Close your eyes and take some deep breaths. Starting with the muscles in your face, tense them, hold it for a few seconds, then relax. Repeat this with the muscles in your neck, shoulders, hand, stomach, back and legs.  Take good care of yourself. Eat a balanced diet and get plenty of rest.  Schedule time for having fun. Take a break from your daily routine to relax. HOME CARE INSTRUCTIONS   Call if you feel overwhelmed by your problems and feel you can no longer manage them on your own.  Return immediately if you feel like hurting yourself or someone else. Document Released: 11/23/2000 Document Revised: 08/22/2011 Document Reviewed: 01/22/2013 Exit[employer] Patient Information 2014 [employer].  

## 2013-08-23 NOTE — Addendum Note (Signed)
Addended by: Tamera PuntWRAY, WENDY S on: 08/23/2013 11:57 AM   Modules accepted: Orders

## 2013-08-23 NOTE — Progress Notes (Signed)
   Subjective:    Patient ID: Gail Daniels, female    DOB: 17-Nov-1965, 48 y.o.   MRN: [id number]  HPI Patient brought in today by self for follow up of ADD. Currently taking adderall 20mg . Behavior-good- able to concentrate better Medication side effects- none Weight loss-none Sleeping habits- good most of time Any concerns- none  * Gerd- On omperazole working well to kep symptoms under control * vasomotor symptoms from menopause- was on prempro but can't afford.   Review of Systems  Constitutional: Negative.   Respiratory: Negative.   Cardiovascular: Negative.   Musculoskeletal: Negative.   Psychiatric/Behavioral: Negative.   All other systems reviewed and are negative.       Objective:   Physical Exam  Constitutional: She is oriented to person, place, and time. She appears well-developed and well-nourished.  Cardiovascular: Normal rate, regular rhythm and normal heart sounds.   Pulmonary/Chest: Effort normal and breath sounds normal.  Abdominal: Soft. Bowel sounds are normal.  Neurological: She is alert and oriented to person, place, and time.  Skin: Skin is warm.  Psychiatric: She has a normal mood and affect. Her behavior is normal. Judgment and thought content normal.    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 GERD    Meds ordered this encounter  Medications  . amphetamine-dextroamphetamine (ADDERALL) 20 MG tablet    Sig: Take 1 tablet (20 mg total) by mouth 2 (two) times daily.    Dispense:  60 tablet    Refill:  0    Order Specific Question:  Supervising Provider    Answer:  Ernestina PennaMOORE, DONALD W [[id number]]  . amphetamine-dextroamphetamine (ADDERALL) 20 MG tablet    Sig: Take 1 tablet (20 mg total) by mouth 3 (three) times daily.    Dispense:  90 tablet    Refill:  0    Do no fill till 09/23/13   Order Specific Question:  Supervising Provider    Answer:  Ernestina PennaMOORE, DONALD W [[id number]]  . omeprazole (PRILOSEC) 40 MG capsule    Sig: Take 1 capsule (40 mg total) by mouth daily.    Dispense:  60 capsule    Refill:  3    Order Specific Question:  Supervising Provider    Answer:  Deborra MedinaMOORE, DONALD W [[id number]]   Stress management Avoid spicy and fatty foods and eating 2 hours prior to bed time RTO in 2 months follow up  Mary-Margaret Daphine DeutscherMartin, FNP

## 2013-09-11 IMAGING — CT CT NECK W/ CM
3 of 4 series · 16 of 33 positions shown, 19 images · IV contrast (Omnipaque 300)
Comparison: None available.

CLINICAL DATA: Right-sided facial pain and swelling.  Question
abscess.

CT NECK WITH CONTRAST
TECHNIQUE: Multidetector CT imaging of the neck was performed with
intravenous contrast.
Contrast: 75mL OMNIPAQUE IOHEXOL 300 MG/ML  SOLN

[Series 2: soft tissue neck 3.0 b31s · axial · 0.52mm/px · z∈[+18,+188]mm · 8 of 69 slices shown, 10 images]
[im 6/69  soft-tissue]
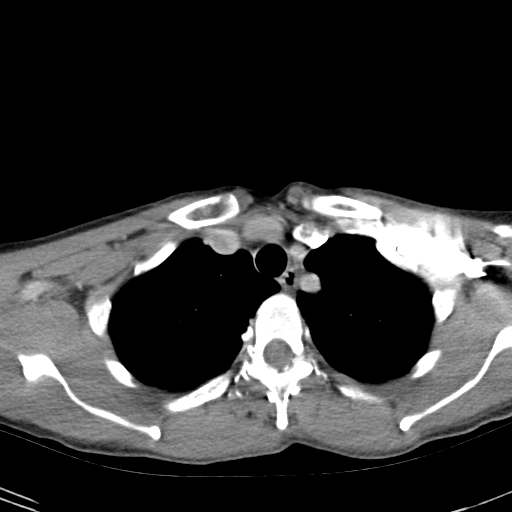
[im 6/69  bone]
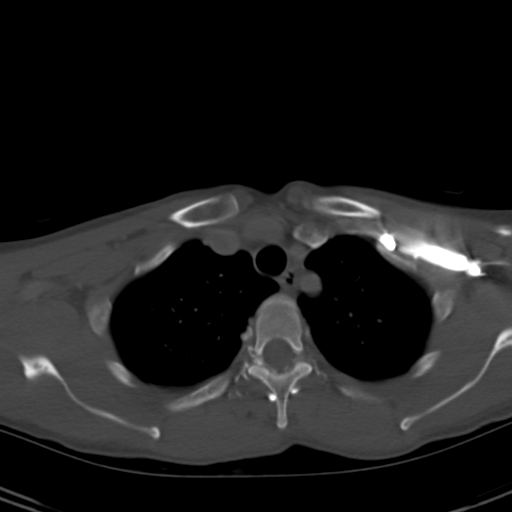
[im 18/69  bone]
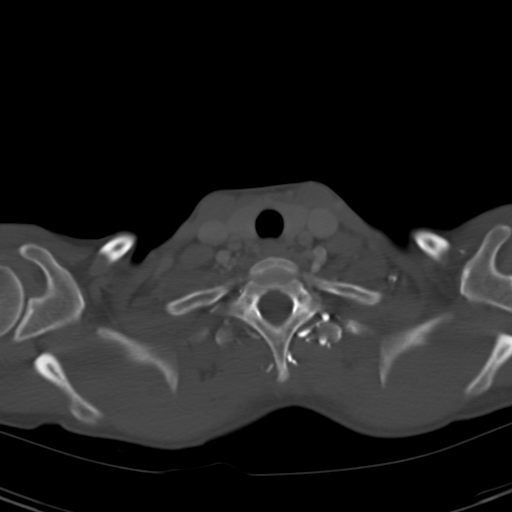
[im 23/69  bone]
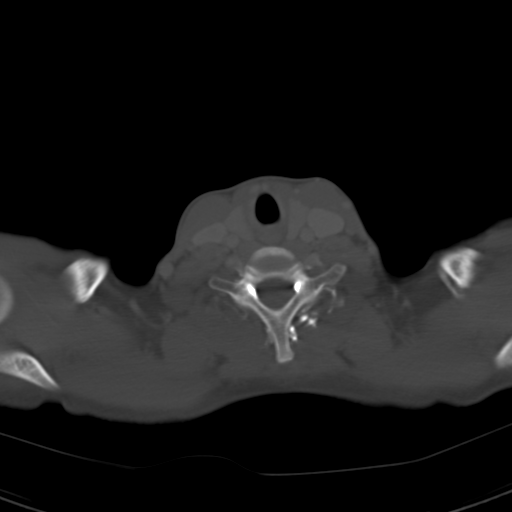
[im 29/69  bone]
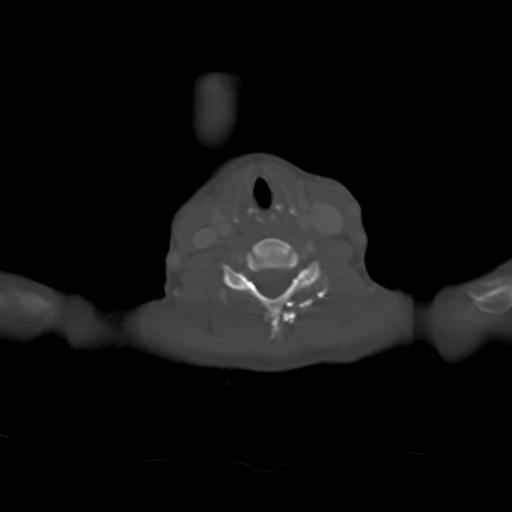
[im 40/69  soft-tissue]
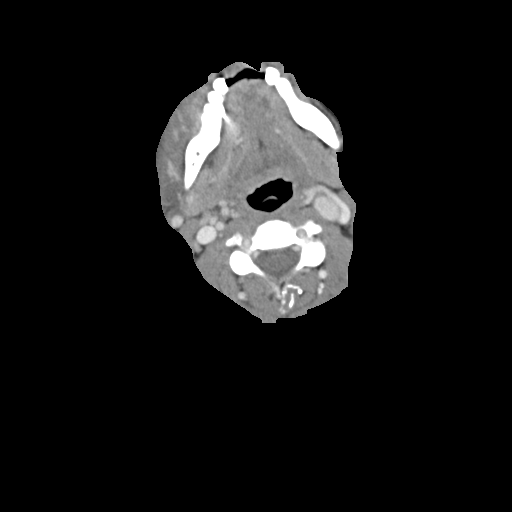
[im 40/69  bone]
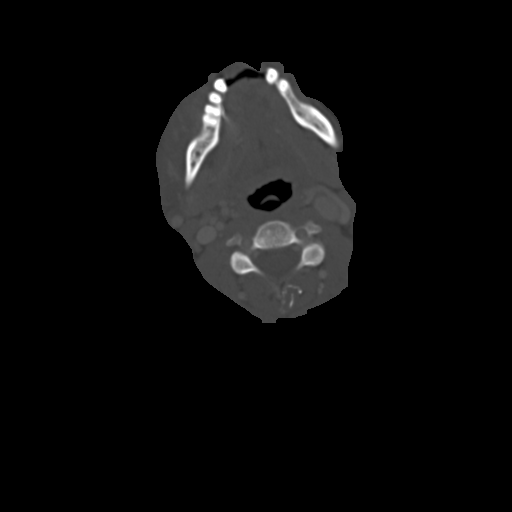
[im 46/69  bone]
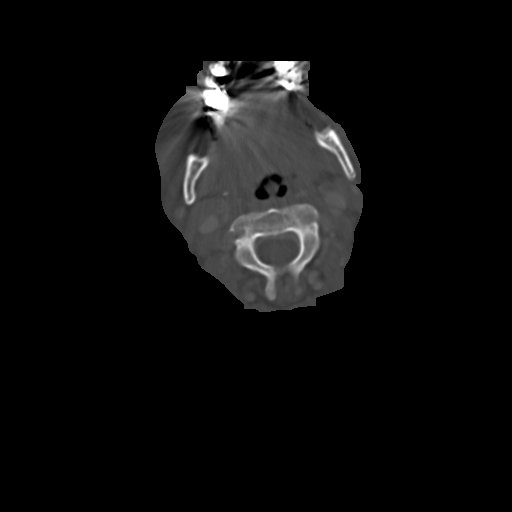
[im 52/69  bone]
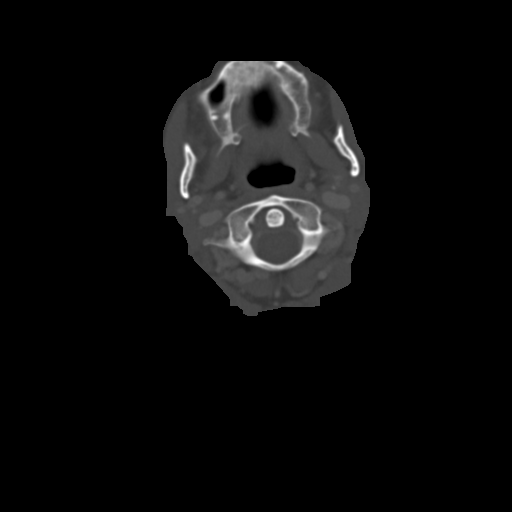
[im 63/69  bone]
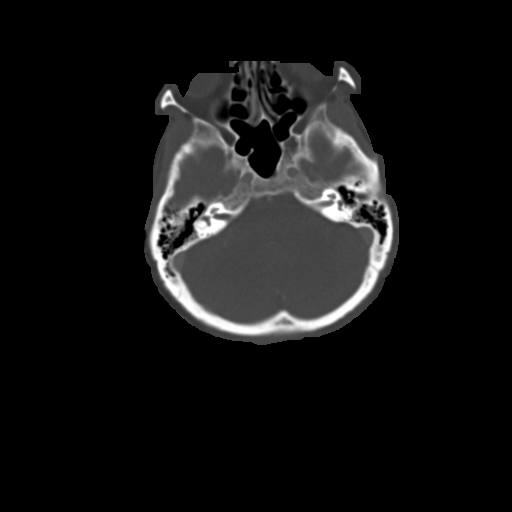

[Series 4: neck 3.0 soft tissue coronal · coronal · 0.41mm/px · 3 of 80 slices shown]
[im 16/80  bone]
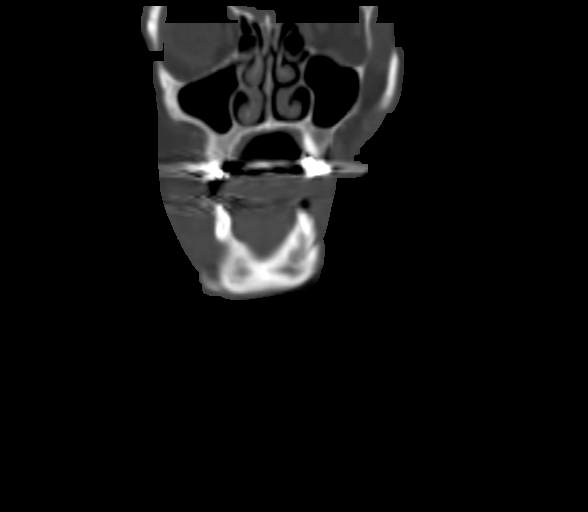
[im 32/80  bone]
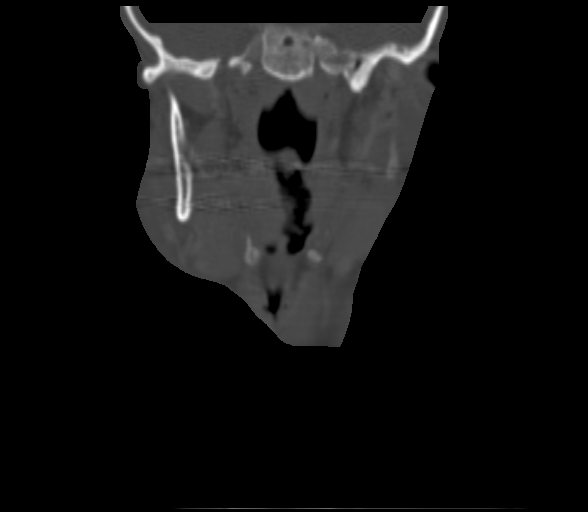
[im 48/80  bone]
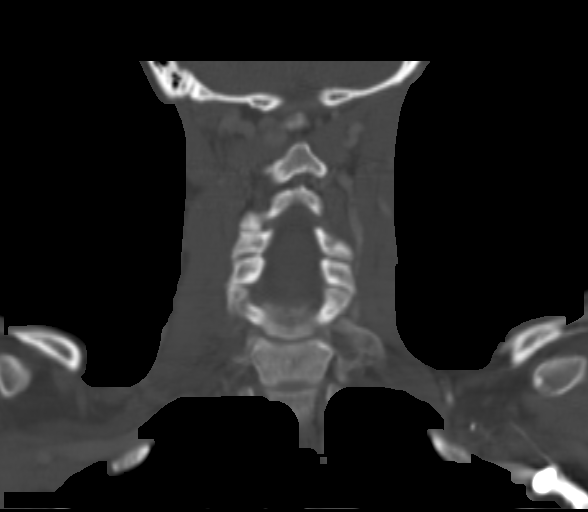

[Series 5: neck 3.0 soft tissue sag · sagittal · 0.40mm/px · 5 of 66 slices shown, 6 images]
[im 22/66  bone]
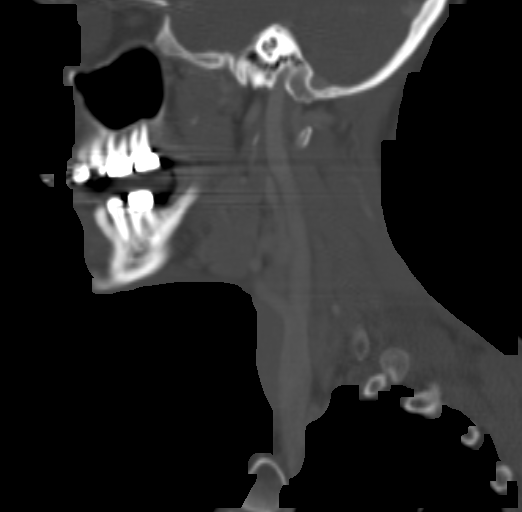
[im 28/66  bone]
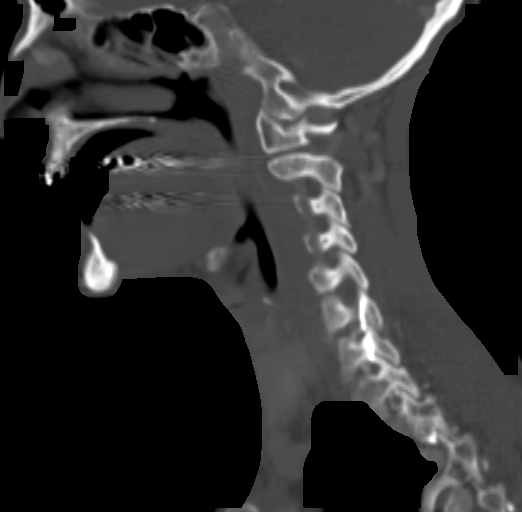
[im 33/66  soft-tissue]
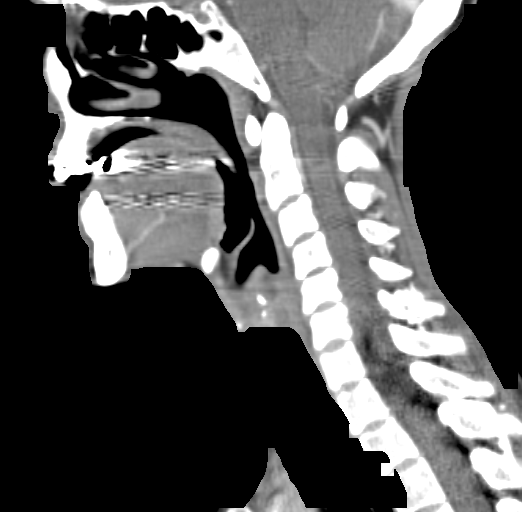
[im 33/66  bone]
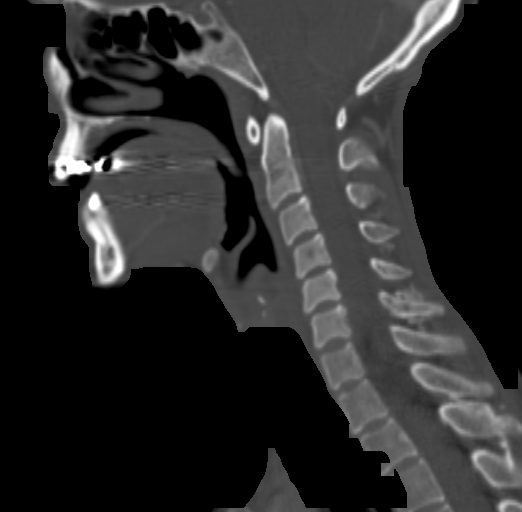
[im 38/66  bone]
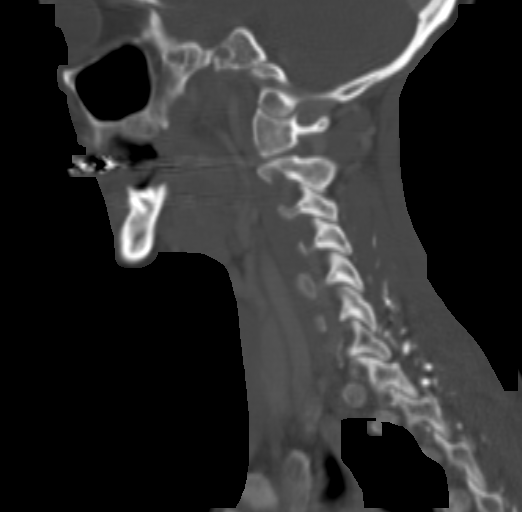
[im 44/66  bone]
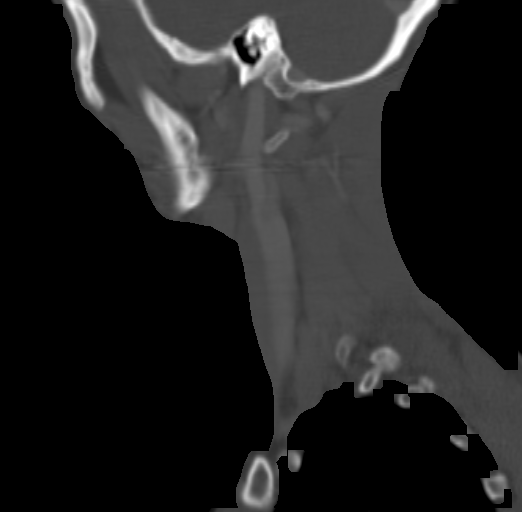

[16 of 33 positions shown; findings below may reference images not displayed]

FINDINGS: Extensive soft tissue swelling is present over the right
mandible with edematous changes within the masticator space.  The
right platysma muscle is thickened.  There is some edema
superficial to the platysma muscle into the sublingual space.

There is significant dental artifact at the level of the right
first mandibular molar.  The etiology of the inflammation is likely
odontogenic with disease involving the more posterior right
mandible at the level of the now absent right second mandibular
molar.  A discrete abscess is not visualized.

Small right submandibular lymph nodes are reactive.  No other
significant adenopathy is present.

No significant mucosal or submucosal lesions are present.

The left neck is unremarkable.

Lung apices are clear.

The bone windows are within normal limits.
IMPRESSION: 1.  Extensive inflammatory changes centered about the right
mandible.  There is significant artifact from dental amalgam
although I favor this has an odontogenic process.  I do not see the
discrete etiology.
2.  Edematous changes within the subcutaneous tissues and extending
into the submandibular and masticator spaces with some reactive
adenopathy on the right.
3.  No significant mucosal disease or left-sided adenopathy.

## 2013-10-18 ENCOUNTER — Telehealth: Payer: Self-pay | Admitting: Nurse Practitioner

## 2013-10-18 MED ORDER — AMPHETAMINE-DEXTROAMPHETAMINE 20 MG PO TABS
20.0000 mg | ORAL_TABLET | Freq: Three times a day (TID) | ORAL | Status: DC
Start: 2013-10-18 — End: 2013-12-12

## 2013-10-18 NOTE — Telephone Encounter (Signed)
rx ready for pick up ntbs for future refills

## 2013-10-18 NOTE — Telephone Encounter (Signed)
No mb rx up front

## 2013-11-14 ENCOUNTER — Telehealth: Payer: Self-pay | Admitting: Nurse Practitioner

## 2013-11-14 MED ORDER — AMPHETAMINE-DEXTROAMPHETAMINE 20 MG PO TABS: 20.0000 mg | ORAL_TABLET | Freq: Three times a day (TID) | ORAL | Status: DC

## 2013-11-14 NOTE — Telephone Encounter (Signed)
rx ready for pickup 

## 2013-11-19 NOTE — Telephone Encounter (Signed)
RX done 11-14-13.

## 2013-12-11 ENCOUNTER — Telehealth: Payer: Self-pay | Admitting: Nurse Practitioner

## 2013-12-12 MED ORDER — AMPHETAMINE-DEXTROAMPHETAMINE 20 MG PO TABS: 20.0000 mg | ORAL_TABLET | Freq: Three times a day (TID) | ORAL | Status: DC

## 2013-12-12 NOTE — Telephone Encounter (Signed)
rx ready for pickup 

## 2013-12-31 ENCOUNTER — Telehealth: Payer: Self-pay | Admitting: Nurse Practitioner

## 2013-12-31 NOTE — Telephone Encounter (Signed)
Patient aware.

## 2013-12-31 NOTE — Telephone Encounter (Signed)
Try dramamine OTC

## 2014-01-15 ENCOUNTER — Telehealth: Payer: Self-pay | Admitting: Nurse Practitioner

## 2014-01-15 MED ORDER — AMPHETAMINE-DEXTROAMPHETAMINE 20 MG PO TABS
20.0000 mg | ORAL_TABLET | Freq: Three times a day (TID) | ORAL | Status: DC
Start: 2014-01-15 — End: 2014-07-02

## 2014-01-15 NOTE — Telephone Encounter (Signed)
no more refills without being seen  

## 2014-01-15 NOTE — Telephone Encounter (Signed)
Patient aware.

## 2014-02-13 ENCOUNTER — Telehealth: Payer: Self-pay | Admitting: Nurse Practitioner

## 2014-02-13 NOTE — Telephone Encounter (Signed)
Patient told with last refill that she could have no more refills without being seen

## 2014-02-14 MED ORDER — AMPHETAMINE-DEXTROAMPHETAMINE 20 MG PO TABS: 20.0000 mg | ORAL_TABLET | Freq: Three times a day (TID) | ORAL | Status: DC

## 2014-02-14 NOTE — Telephone Encounter (Signed)
Documentation on chart says that she was informed that NTBS

## 2014-02-14 NOTE — Telephone Encounter (Signed)
Appt scheduled on 02/20/14 for ADD rck. She will be out of medication this weekend. Can you write script to pickup either tonight or tomorrow morning?

## 2014-02-14 NOTE — Telephone Encounter (Signed)
rx ready for pickup 

## 2014-02-20 ENCOUNTER — Ambulatory Visit: Payer: Self-pay | Admitting: Nurse Practitioner

## 2014-07-02 ENCOUNTER — Ambulatory Visit (INDEPENDENT_AMBULATORY_CARE_PROVIDER_SITE_OTHER): Payer: BLUE CROSS/BLUE SHIELD | Admitting: Family

## 2014-07-02 ENCOUNTER — Encounter (INDEPENDENT_AMBULATORY_CARE_PROVIDER_SITE_OTHER): Payer: Self-pay

## 2014-07-02 ENCOUNTER — Encounter: Payer: Self-pay | Admitting: Family

## 2014-07-02 VITALS — BP 120/78 | HR 89 | Temp 97.5°F | Ht 65.0 in | Wt 133.8 lb

## 2014-07-02 DIAGNOSIS — K219 Gastro-esophageal reflux disease without esophagitis: Secondary | ICD-10-CM

## 2014-07-02 DIAGNOSIS — F900 Attention-deficit hyperactivity disorder, predominantly inattentive type: Secondary | ICD-10-CM

## 2014-07-02 DIAGNOSIS — F988 Other specified behavioral and emotional disorders with onset usually occurring in childhood and adolescence: Secondary | ICD-10-CM

## 2014-07-02 DIAGNOSIS — Z780 Asymptomatic menopausal state: Secondary | ICD-10-CM

## 2014-07-02 DIAGNOSIS — Z0000 Encounter for general adult medical examination without abnormal findings: Secondary | ICD-10-CM

## 2014-07-02 MED ORDER — OMEPRAZOLE 40 MG PO CPDR: 40.0000 mg | DELAYED_RELEASE_CAPSULE | Freq: Every day | ORAL | Status: DC

## 2014-07-02 MED ORDER — AMPHETAMINE-DEXTROAMPHETAMINE 20 MG PO TABS: 20.0000 mg | ORAL_TABLET | Freq: Three times a day (TID) | ORAL | Status: DC

## 2014-07-02 MED ORDER — CONJ ESTROG-MEDROXYPROGEST ACE 0.3-1.5 MG PO TABS: 1.0000 | ORAL_TABLET | Freq: Every day | ORAL | Status: DC

## 2014-07-02 NOTE — Progress Notes (Signed)
Subjective:    Patient ID: Gail Daniels, female    DOB: 12/31/1965, 49 y.o.   MRN: [id number]  Gastrophageal Reflux She complains of heartburn. She reports no belching, no coughing, no dysphagia, no sore throat, no stridor or no wheezing. This is a chronic problem. The current episode started more than 1 year ago. The problem occurs frequently. The problem has been waxing and waning. The heartburn duration is several minutes. The symptoms are aggravated by certain foods. She has tried a PPI for the symptoms. The treatment provided significant relief.  ADD  Pt currently taking adderall 27m. Pt states she is able to concentrate and denies any side effects at this time. Menopause Pt currently taking estrogen for night sweats, hot flashes, and not energy. Pt states these symptoms  have improved since starting estrogen. Pt denies family hx of breast ca or cervical ca. Pt denies any concerns at this time.     Review of Systems  Constitutional: Negative.   HENT: Negative.  Negative for sore throat.   Eyes: Negative.   Respiratory: Negative.  Negative for cough, shortness of breath and wheezing.   Cardiovascular: Negative.  Negative for palpitations.  Gastrointestinal: Positive for heartburn. Negative for dysphagia.  Endocrine: Negative.   Genitourinary: Negative.   Musculoskeletal: Positive for back pain.  Neurological: Negative.  Negative for headaches.  Hematological: Negative.   Psychiatric/Behavioral: Negative.   All other systems reviewed and are negative.      Objective:   Physical Exam  Constitutional: She is oriented to person, place, and time. She appears well-developed and well-nourished. No distress.  HENT:  Head: Normocephalic and atraumatic.  Right Ear: External ear normal.  Mouth/Throat: Oropharynx is clear and moist.  Eyes: Pupils are equal, round, and reactive to light.  Neck: Normal range of motion. Neck supple. No thyromegaly present.  Cardiovascular: Normal rate,  regular rhythm, normal heart sounds and intact distal pulses.   No murmur heard. Pulmonary/Chest: Effort normal and breath sounds normal. No respiratory distress. She has no wheezes.  Abdominal: Soft. Bowel sounds are normal. She exhibits no distension. There is no tenderness.  Musculoskeletal: Normal range of motion. She exhibits no edema or tenderness.  Neurological: She is alert and oriented to person, place, and time. She has normal reflexes. No cranial nerve deficit.  Skin: Skin is warm and dry.  Psychiatric: She has a normal mood and affect. Her behavior is normal. Judgment and thought content normal.  Vitals reviewed.   BP 120/78 mmHg  Pulse 89  Temp(Src) 97.5 F (36.4 C) (Oral)  Ht 5' 5" (1.651 m)  Wt 133 lb 12.8 oz (60.691 kg)  BMI 22.27 kg/m2       Assessment & Plan:  1. Gastroesophageal reflux disease, esophagitis presence not specified - CMP14+EGFR - omeprazole (PRILOSEC) 40 MG capsule; Take 1 capsule (40 mg total) by mouth daily.  Dispense: 90 capsule; Refill: 4  2. Menopause - Risks discussed- Pt would like stay on medication at this time CMP14+EGFR - estrogen, conjugated,-medroxyprogesterone (PREMPRO) 0.3-1.5 MG per tablet; Take 1 tablet by mouth daily.  Dispense: 90 tablet; Refill: 1  3. ADD (attention deficit disorder) without hyperactivity - CMP14+EGFR - amphetamine-dextroamphetamine (ADDERALL) 20 MG tablet; Take 1 tablet (20 mg total) by mouth 3 (three) times daily.  Dispense: 90 tablet; Refill: 0 - amphetamine-dextroamphetamine (ADDERALL) 20 MG tablet; Take 1 tablet (20 mg total) by mouth 3 (three) times daily.  Dispense: 90 tablet; Refill: 0  4. Annual physical exam - Lipid panel -  Vit D  25 hydroxy (rtn osteoporosis monitoring)   Continue all meds Labs pending Health Maintenance reviewed Diet and exercise encouraged RTO 3 month  Evelina Dun, FNP

## 2014-07-02 NOTE — Patient Instructions (Signed)

## 2014-07-03 ENCOUNTER — Other Ambulatory Visit: Payer: Self-pay | Admitting: Family

## 2014-07-03 ENCOUNTER — Telehealth: Payer: Self-pay | Admitting: Family

## 2014-07-03 DIAGNOSIS — E559 Vitamin D deficiency, unspecified: Secondary | ICD-10-CM

## 2014-07-03 DIAGNOSIS — E785 Hyperlipidemia, unspecified: Secondary | ICD-10-CM

## 2014-07-03 LAB — CMP14+EGFR
ALBUMIN: 3.9 g/dL (ref 3.5–5.5)
ALK PHOS: 60 IU/L (ref 39–117)
ALT: 7 IU/L (ref 0–32)
AST: 8 IU/L (ref 0–40)
Albumin/Globulin Ratio: 2 (ref 1.1–2.5)
BUN/Creatinine Ratio: 14 (ref 9–23)
BUN: 15 mg/dL (ref 6–24)
CO2: 28 mmol/L (ref 18–29)
Calcium: 8.7 mg/dL (ref 8.7–10.2)
Chloride: 107 mmol/L (ref 97–108)
Creatinine, Ser: 1.11 mg/dL — ABNORMAL HIGH (ref 0.57–1.00)
GFR calc Af Amer: 68 mL/min/{1.73_m2} (ref >59)
GFR calc non Af Amer: 59 mL/min/{1.73_m2} — ABNORMAL LOW (ref >59)
GLOBULIN, TOTAL: 2 g/dL (ref 1.5–4.5)
Glucose: 64 mg/dL — ABNORMAL LOW (ref 65–99)
POTASSIUM: 4.1 mmol/L (ref 3.5–5.2)
Sodium: 146 mmol/L — ABNORMAL HIGH (ref 134–144)
Total Bilirubin: <0.2 mg/dL (ref 0.0–1.2)
Total Protein: 5.9 g/dL — ABNORMAL LOW (ref 6.0–8.5)

## 2014-07-03 LAB — LIPID PANEL
Chol/HDL Ratio: 3.4 ratio units (ref 0.0–4.4)
Cholesterol, Total: 182 mg/dL (ref 100–199)
HDL: 53 mg/dL (ref >39)
LDL CALC: 105 mg/dL — AB (ref 0–99)
TRIGLYCERIDES: 119 mg/dL (ref 0–149)
VLDL CHOLESTEROL CAL: 24 mg/dL (ref 5–40)

## 2014-07-03 LAB — VITAMIN D 25 HYDROXY (VIT D DEFICIENCY, FRACTURES): VIT D 25 HYDROXY: 10.2 ng/mL — AB (ref 30.0–100.0)

## 2014-07-03 MED ORDER — PRAVASTATIN SODIUM 20 MG PO TABS: 20.0000 mg | ORAL_TABLET | Freq: Every day | ORAL | Status: DC

## 2014-07-03 MED ORDER — VITAMIN D (ERGOCALCIFEROL) 1.25 MG (50000 UNIT) PO CAPS: 50000.0000 [IU] | ORAL_CAPSULE | ORAL | Status: DC

## 2014-07-03 NOTE — Telephone Encounter (Signed)
-----   Message from Junie Spencerhristy A Hawks, FNP sent at 07/03/2014  9:24 AM EST ----- Kidney and liver function stable Cholesterol levels slightly elevated Vit D levels low- RX sent to pharmacy

## 2014-07-30 ENCOUNTER — Other Ambulatory Visit: Payer: Self-pay | Admitting: Family

## 2014-07-30 NOTE — Telephone Encounter (Signed)
Aware, adderall script ready.

## 2014-07-30 NOTE — Telephone Encounter (Signed)
Last seen 07/02/14 Gail BonitoChristy  If approved print and route to nurse

## 2014-08-26 ENCOUNTER — Telehealth: Payer: Self-pay | Admitting: Family

## 2014-08-26 DIAGNOSIS — F988 Other specified behavioral and emotional disorders with onset usually occurring in childhood and adolescence: Secondary | ICD-10-CM

## 2014-08-28 MED ORDER — AMPHETAMINE-DEXTROAMPHETAMINE 20 MG PO TABS: 20.0000 mg | ORAL_TABLET | Freq: Three times a day (TID) | ORAL | Status: DC

## 2014-08-28 NOTE — Telephone Encounter (Signed)
Pt aware.

## 2014-08-28 NOTE — Telephone Encounter (Signed)
adderall rx ready for pick up  

## 2014-09-23 ENCOUNTER — Telehealth: Payer: Self-pay | Admitting: Nurse Practitioner

## 2014-09-23 MED ORDER — AMPHETAMINE-DEXTROAMPHETAMINE 20 MG PO TABS: 20.0000 mg | ORAL_TABLET | Freq: Three times a day (TID) | ORAL | Status: DC

## 2014-09-23 NOTE — Telephone Encounter (Signed)
adderall rx ready for pick up  

## 2014-09-23 NOTE — Telephone Encounter (Signed)
Patient aware rx ready to be picked up 

## 2014-10-21 ENCOUNTER — Telehealth: Payer: Self-pay | Admitting: Nurse Practitioner

## 2014-10-21 DIAGNOSIS — F988 Other specified behavioral and emotional disorders with onset usually occurring in childhood and adolescence: Secondary | ICD-10-CM

## 2014-10-21 MED ORDER — AMPHETAMINE-DEXTROAMPHETAMINE 20 MG PO TABS: 20.0000 mg | ORAL_TABLET | Freq: Three times a day (TID) | ORAL | Status: DC

## 2014-10-21 NOTE — Telephone Encounter (Signed)
adderall rx ready for pick up no more refills without being seen  

## 2014-10-21 NOTE — Telephone Encounter (Signed)
Detailed message left for patient that rx is ready to be picked up but she will have to be seen for any further refills.

## 2014-11-17 ENCOUNTER — Other Ambulatory Visit: Payer: Self-pay | Admitting: Family

## 2014-11-17 DIAGNOSIS — F988 Other specified behavioral and emotional disorders with onset usually occurring in childhood and adolescence: Secondary | ICD-10-CM

## 2014-11-17 MED ORDER — AMPHETAMINE-DEXTROAMPHETAMINE 20 MG PO TABS: 20.0000 mg | ORAL_TABLET | Freq: Three times a day (TID) | ORAL | Status: DC

## 2014-11-17 NOTE — Telephone Encounter (Signed)
Patient notified that rx up front and ready for pick up 

## 2014-11-17 NOTE — Telephone Encounter (Signed)
RX ready for pick up 

## 2014-11-24 ENCOUNTER — Ambulatory Visit: Payer: BLUE CROSS/BLUE SHIELD | Admitting: Family

## 2014-11-25 ENCOUNTER — Encounter: Payer: Self-pay | Admitting: Family

## 2014-11-30 ENCOUNTER — Encounter: Payer: Self-pay | Admitting: Family

## 2014-12-17 ENCOUNTER — Ambulatory Visit (INDEPENDENT_AMBULATORY_CARE_PROVIDER_SITE_OTHER): Payer: 59 | Admitting: Family

## 2014-12-17 ENCOUNTER — Encounter: Payer: Self-pay | Admitting: Family

## 2014-12-17 VITALS — BP 112/84 | HR 78 | Temp 98.2°F | Ht 65.0 in | Wt 130.0 lb

## 2014-12-17 DIAGNOSIS — E785 Hyperlipidemia, unspecified: Secondary | ICD-10-CM | POA: Diagnosis not present

## 2014-12-17 DIAGNOSIS — E559 Vitamin D deficiency, unspecified: Secondary | ICD-10-CM | POA: Diagnosis not present

## 2014-12-17 DIAGNOSIS — F988 Other specified behavioral and emotional disorders with onset usually occurring in childhood and adolescence: Secondary | ICD-10-CM

## 2014-12-17 DIAGNOSIS — K219 Gastro-esophageal reflux disease without esophagitis: Secondary | ICD-10-CM | POA: Diagnosis not present

## 2014-12-17 DIAGNOSIS — Z780 Asymptomatic menopausal state: Secondary | ICD-10-CM | POA: Diagnosis not present

## 2014-12-17 DIAGNOSIS — F17200 Nicotine dependence, unspecified, uncomplicated: Secondary | ICD-10-CM

## 2014-12-17 DIAGNOSIS — F32A Depression, unspecified: Secondary | ICD-10-CM

## 2014-12-17 DIAGNOSIS — F900 Attention-deficit hyperactivity disorder, predominantly inattentive type: Secondary | ICD-10-CM | POA: Diagnosis not present

## 2014-12-17 DIAGNOSIS — Z720 Tobacco use: Secondary | ICD-10-CM

## 2014-12-17 DIAGNOSIS — F329 Major depressive disorder, single episode, unspecified: Secondary | ICD-10-CM

## 2014-12-17 MED ORDER — ESCITALOPRAM OXALATE 10 MG PO TABS: 10.0000 mg | ORAL_TABLET | Freq: Every day | ORAL | Status: DC

## 2014-12-17 MED ORDER — AMPHETAMINE-DEXTROAMPHETAMINE 20 MG PO TABS: 20.0000 mg | ORAL_TABLET | Freq: Three times a day (TID) | ORAL | Status: DC

## 2014-12-17 MED ORDER — PANTOPRAZOLE SODIUM 20 MG PO TBEC: 20.0000 mg | DELAYED_RELEASE_TABLET | Freq: Every day | ORAL | Status: DC

## 2014-12-17 NOTE — Patient Instructions (Signed)

## 2014-12-17 NOTE — Progress Notes (Signed)
Subjective:    Patient ID: Gail Daniels, female    DOB: 07-Nov-1965, 49 y.o.   MRN: [id number]  PT presents to the office today for chronic follow up.  Gastrophageal Reflux She complains of heartburn, a hoarse voice and a sore throat. She reports no belching, no coughing, no dysphagia, no stridor or no wheezing. This is a chronic problem. The current episode started more than 1 year ago. The problem occurs frequently. The problem has been waxing and waning. The heartburn duration is several minutes. The symptoms are aggravated by certain foods. She has tried a PPI for the symptoms. The treatment provided no relief.  Hyperlipidemia This is a chronic problem. The current episode started more than 1 year ago. The problem is uncontrolled. Recent lipid tests were reviewed and are high. Factors aggravating her hyperlipidemia include smoking. Pertinent negatives include no leg pain or shortness of breath. Current antihyperlipidemic treatment includes diet change. The current treatment provides no improvement of lipids. Risk factors for coronary artery disease include dyslipidemia, post-menopausal and a sedentary lifestyle.  ADD  Pt currently taking adderall 4m. Pt states she is able to concentrate and denies any side effects at this time. Menopause Pt currently taking estrogen for night sweats, hot flashes, and no energy. Pt states these symptoms  have improved since starting estrogen. Pt denies family hx of breast ca or cervical ca. Pt denies any concerns at this time.     Review of Systems  Constitutional: Negative.   HENT: Positive for hoarse voice and sore throat.   Eyes: Negative.   Respiratory: Negative.  Negative for cough, shortness of breath and wheezing.   Cardiovascular: Negative.  Negative for palpitations.  Gastrointestinal: Positive for heartburn. Negative for dysphagia.  Endocrine: Negative.   Genitourinary: Negative.   Musculoskeletal: Negative.   Neurological: Negative.   Negative for headaches.  Hematological: Negative.   Psychiatric/Behavioral: Negative.   All other systems reviewed and are negative.      Objective:   Physical Exam  Constitutional: She is oriented to person, place, and time. She appears well-developed and well-nourished. No distress.  HENT:  Head: Normocephalic and atraumatic.  Right Ear: External ear normal.  Left Ear: External ear normal.  Nose: Nose normal.  Mouth/Throat: Oropharynx is clear and moist.  Eyes: Pupils are equal, round, and reactive to light.  Neck: Normal range of motion. Neck supple. No thyromegaly present.  Cardiovascular: Normal rate, regular rhythm, normal heart sounds and intact distal pulses.   No murmur heard. Pulmonary/Chest: Effort normal and breath sounds normal. No respiratory distress. She has no wheezes.  Abdominal: Soft. Bowel sounds are normal. She exhibits no distension. There is no tenderness.  Musculoskeletal: Normal range of motion. She exhibits no edema or tenderness.  Broken leg- leg in boot up to knee   Neurological: She is alert and oriented to person, place, and time. She has normal reflexes. No cranial nerve deficit.  Skin: Skin is warm and dry.  Psychiatric: She has a normal mood and affect. Her behavior is normal. Judgment and thought content normal.  Vitals reviewed.    BP 112/84 mmHg  Pulse 78  Temp(Src) 98.2 F (36.8 C) (Oral)  Ht 5' 5"  (1.651 m)  Wt 130 lb (58.968 kg)  BMI 21.63 kg/m2      Assessment & Plan:  1. Gastroesophageal reflux disease, esophagitis presence not specified -Pt started on Protonix today- Pt on 40 mg of prilosec with no relief - CMP14+EGFR - pantoprazole (PROTONIX) 20 MG tablet; Take  1 tablet (20 mg total) by mouth daily.  Dispense: 90 tablet; Refill: 3  2. ADD (attention deficit disorder) without hyperactivity - CMP14+EGFR - amphetamine-dextroamphetamine (ADDERALL) 20 MG tablet; Take 1 tablet (20 mg total) by mouth 3 (three) times daily.   Dispense: 90 tablet; Refill: 0 - amphetamine-dextroamphetamine (ADDERALL) 20 MG tablet; Take 1 tablet (20 mg total) by mouth 3 (three) times daily.  Dispense: 90 tablet; Refill: 0  3. Hyperlipemia -Pt not currently taking any medications, but is willing if cholesterol levels are still elevated - CMP14+EGFR - Lipid panel  4. Menopause -Discussed risks of estrogen with pt - CMP14+EGFR - Vit D  25 hydroxy (rtn osteoporosis monitoring) - escitalopram (LEXAPRO) 10 MG tablet; Take 1 tablet (10 mg total) by mouth daily.  Dispense: 90 tablet; Refill: 1  5. Vitamin D deficiency - CMP14+EGFR - Vit D  25 hydroxy (rtn osteoporosis monitoring)  6. Depression -Pt started on lexapro 10 mg today - CMP14+EGFR - escitalopram (LEXAPRO) 10 MG tablet; Take 1 tablet (10 mg total) by mouth daily.  Dispense: 90 tablet; Refill: 1  7. Current smoker -Smoking cessation discussed   Continue all meds Labs pending Health Maintenance reviewed Diet and exercise encouraged RTO 3 months  Evelina Dun, FNP

## 2014-12-18 ENCOUNTER — Other Ambulatory Visit: Payer: Self-pay | Admitting: Family

## 2014-12-18 ENCOUNTER — Telehealth: Payer: Self-pay | Admitting: *Deleted

## 2014-12-18 LAB — CMP14+EGFR
ALT: 10 IU/L (ref 0–32)
AST: 13 IU/L (ref 0–40)
Albumin/Globulin Ratio: 2 (ref 1.1–2.5)
Albumin: 4 g/dL (ref 3.5–5.5)
Alkaline Phosphatase: 66 IU/L (ref 39–117)
BUN/Creatinine Ratio: 13 (ref 9–23)
BUN: 12 mg/dL (ref 6–24)
Bilirubin Total: <0.2 mg/dL (ref 0.0–1.2)
CALCIUM: 9.1 mg/dL (ref 8.7–10.2)
CO2: 25 mmol/L (ref 18–29)
CREATININE: 0.94 mg/dL (ref 0.57–1.00)
Chloride: 102 mmol/L (ref 97–108)
GFR calc Af Amer: 83 mL/min/{1.73_m2} (ref >59)
GFR, EST NON AFRICAN AMERICAN: 72 mL/min/{1.73_m2} (ref >59)
Globulin, Total: 2 g/dL (ref 1.5–4.5)
Glucose: 84 mg/dL (ref 65–99)
Potassium: 4.4 mmol/L (ref 3.5–5.2)
SODIUM: 142 mmol/L (ref 134–144)
Total Protein: 6 g/dL (ref 6.0–8.5)

## 2014-12-18 LAB — LIPID PANEL
CHOLESTEROL TOTAL: 193 mg/dL (ref 100–199)
Chol/HDL Ratio: 3.9 ratio units (ref 0.0–4.4)
HDL: 50 mg/dL (ref >39)
LDL Calculated: 126 mg/dL — ABNORMAL HIGH (ref 0–99)
Triglycerides: 86 mg/dL (ref 0–149)
VLDL Cholesterol Cal: 17 mg/dL (ref 5–40)

## 2014-12-18 LAB — VITAMIN D 25 HYDROXY (VIT D DEFICIENCY, FRACTURES): VIT D 25 HYDROXY: 15.7 ng/mL — AB (ref 30.0–100.0)

## 2014-12-18 MED ORDER — SIMVASTATIN 20 MG PO TABS: 20.0000 mg | ORAL_TABLET | Freq: Every day | ORAL | Status: DC

## 2014-12-18 MED ORDER — VITAMIN D (ERGOCALCIFEROL) 1.25 MG (50000 UNIT) PO CAPS: 50000.0000 [IU] | ORAL_CAPSULE | ORAL | Status: DC

## 2014-12-18 NOTE — Telephone Encounter (Signed)
-----   Message from Christy A Hawks, FNP sent at 12/18/2014  8:13 AM EDT ----- Kidney and liver function stable Cholesterol levels elevated- Zocor rx sent to pharmacy Vit D levels low- Prescription sent to pharmacy   

## 2014-12-18 NOTE — Telephone Encounter (Signed)
-----   Message from Junie Spencerhristy A Hawks, FNP sent at 12/18/2014  8:13 AM EDT ----- Kidney and liver function stable Cholesterol levels elevated- Zocor rx sent to pharmacy Vit D levels low- Prescription sent to pharmacy

## 2014-12-29 ENCOUNTER — Ambulatory Visit: Payer: Self-pay | Attending: Orthopedic Surgery | Admitting: Physical Therapy

## 2014-12-29 DIAGNOSIS — S82891A Other fracture of right lower leg, initial encounter for closed fracture: Secondary | ICD-10-CM | POA: Insufficient documentation

## 2014-12-29 DIAGNOSIS — M25571 Pain in right ankle and joints of right foot: Secondary | ICD-10-CM | POA: Insufficient documentation

## 2014-12-29 DIAGNOSIS — M25671 Stiffness of right ankle, not elsewhere classified: Secondary | ICD-10-CM | POA: Insufficient documentation

## 2014-12-29 NOTE — Patient Instructions (Signed)
Patient instructed in right ankle ABC's.
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
## 2014-12-30 ENCOUNTER — Telehealth: Payer: Self-pay

## 2014-12-30 NOTE — Telephone Encounter (Signed)
Insurance prior authorized Amphetamine/Dextroamphetamine through 12/25/15  [place] [id number]

## 2014-12-31 ENCOUNTER — Ambulatory Visit: Payer: Self-pay | Admitting: Physical Therapy

## 2014-12-31 DIAGNOSIS — S82891A Other fracture of right lower leg, initial encounter for closed fracture: Secondary | ICD-10-CM

## 2014-12-31 DIAGNOSIS — M25571 Pain in right ankle and joints of right foot: Secondary | ICD-10-CM

## 2014-12-31 DIAGNOSIS — M25671 Stiffness of right ankle, not elsewhere classified: Secondary | ICD-10-CM

## 2014-12-31 NOTE — Therapy (Signed)
[facility] [address] [place], [place], [address] Phone: [phone]   Fax:  [phone]  Physical Therapy Treatment  Patient Details  Name: Gail Daniels MRN: [id number] Date of Birth: 1965-09-11 Referring Provider:  Junie Spencer, FNP  Encounter Date: 12/31/2014      PT End of Session - 12/31/14 1037    Visit Number 2   Number of Visits 24   Date for PT Re-Evaluation 03/23/15   PT Start Time 0948   PT Stop Time 1052   PT Time Calculation (min) 64 min   Activity Tolerance Patient tolerated treatment well;No increased pain   Behavior During Therapy [facility] for tasks assessed/performed      Past Medical History  Diagnosis Date  . Adult ADHD   . Hypertension   . GERD (gastroesophageal reflux disease)     Past Surgical History  Procedure Laterality Date  . Cesarean section    . Tubal ligation      There were no vitals filed for this visit.  Visit Diagnosis:  Stiffness of ankle joint, right  Right ankle pain  Ankle fracture, right      Subjective Assessment - 12/31/14 0949    Subjective Only has pain when she puts weight through it.   Currently in Pain? Yes   Pain Score 7    Pain Location Ankle   Pain Orientation Right   Pain Descriptors / Indicators Aching;Constant            [facility] PT Assessment - 12/31/14 0001    Assessment   Onset Date/Surgical Date 11/13/14  surgery date                     [facility] Adult PT Treatment/Exercise - 12/31/14 0001    Exercises   Exercises Knee/Hip;Ankle   Knee/Hip Exercises: Supine   Straight Leg Raises Strengthening;Right;20 reps   Knee/Hip Exercises: Sidelying   Hip ABduction Strengthening;Right;20 reps   Hip ADduction Strengthening;Right;20 reps   Knee/Hip Exercises: Prone   Hip Extension Strengthening;Right;20 reps   Modalities   Modalities Vasopneumatic   Vasopneumatic   Number Minutes Vasopneumatic  15 minutes   Vasopnuematic Location  Ankle   Vasopneumatic Pressure Medium   Vasopneumatic Temperature  3*   Ankle Exercises: Stretches   Gastroc Stretch 2 reps;30 seconds   Ankle Exercises: Standing   Other Standing Ankle Exercises Weight shifting side to side and balls of feet to toes x 20 ea   Ankle Exercises: Seated   Ankle Circles/Pumps AROM;20 reps  30 pumps; 20 circles each way   Towel Inversion/Eversion --  20 each without towel.   Other Seated Ankle Exercises Rocker board x 10 then 30 sec hold x 2 for PF, DF Ever                PT Education - 12/31/14 1441    Education provided Yes   Education Details HEP: weight shifting, ankle rom, and hip SLR lying.   Person(s) Educated Patient   Methods Explanation;Demonstration;Handout   Comprehension Verbalized understanding;Returned demonstration             PT Long Term Goals - 12/29/14 0942    PT LONG TERM GOAL #1   Title Ind with advanced HEP.   Time 12   Period Weeks   Status New   PT LONG TERM GOAL #2   Title Increase right ankle dorsiflexion to 6- 8 degrees to normalize the patient's gait pattern   Time 12  Period Weeks   Status New   PT LONG TERM GOAL #3   Title Increase right ankle strength to 4+ to 5/5 to increase stability for functional tasks   Time 12   Period Weeks   Status New   PT LONG TERM GOAL #4   Title Walk a community distance with no gait deviation and pain not > 3/10.   Time 12   Period Weeks   Status New   PT LONG TERM GOAL #5   Title Perform ADL's with pain not > 3/10.               Plan - 12/31/14 1442    Clinical Impression Statement Patient tolerated therex without increased pain including weight shifting in rollator. Demonstrates right hip weakness with therex.   PT Next Visit Plan MD order:  Progress to WBAT to right ankle.  Slowly wean from boot as tolerated.  Work on ROM an dstrengthening of right ankle.  Continue gentle ROM.  Seated rockerboeard would be helpful.        Problem List Patient Active  Problem List   Diagnosis Date Noted  . Current smoker 12/17/2014  . Hyperlipemia 07/03/2014  . Vitamin D deficiency 07/03/2014  . Menopause 07/02/2014  . 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 GERD 06/25/2010    Solon PalmJulie Mckaela Howley PT  12/31/2014, 2:45 PM  [facility] [address] [place], [place], [address] Phone: [phone]   Fax:  [phone]

## 2014-12-31 NOTE — Patient Instructions (Signed)
STAND WITHOUT BOOT: WEIGHT SHIFT SIDE TO SIDE AND FRONT TO BACK. 2-3 X DAY AS TOLERATED.  Gastroc / Heel Cord Stretch - Seated With Towel   Sit on floor, towel around ball of foot. Gently pull foot in toward body, stretching heel cord and calf. Hold for _30__ seconds. Repeat _3__ times. Do 3-4___ times per day.   Ankle Alphabet   Using left ankle and foot only, trace the letters of the alphabet. Perform A to Z. Repeat _1___ times per set. Do ____ sets per session. Do __2-3__ sessions per day.  http://orth.exer.us/16   Copyright  VHI. All rights reserved.    Ankle Circles   Slowly rotate right foot and ankle clockwise then counterclockwise. Gradually increase range of motion. Avoid pain. Circle __10__ times each direction per set. Do ____ sets per session. Do 2-3____ sessions per day.  http://orth.exer.us/30   Copyright  VHI. All rights reserved.    ROM: Plantar / Dorsiflexion   With left leg relaxed, gently flex and extend ankle. Move through full range of motion. Avoid pain. Repeat __20__ times per set. Do ____ sets per session. Do __3-4__ sessions per day.  http://orth.exer.us/34   Copyright  VHI. All rights reserved.    ROM: Inversion / Eversion   With left leg relaxed, gently turn ankle and foot in and out. Move through full range of motion. Avoid pain. Repeat _20___ times per set. Do ____ sets per session. Do _3-4___ sessions per day.  http://orth.exer.us/36   Copyright  VHI. All rights reserved.    FOR HIP EXERCISES DO 1-3 SETS OF 10, ONE TIME PER DAY: Hip Flexion / Knee Extension: Straight-Leg Raise (Eccentric)   Lie on back. Lift leg with knee straight. Slowly lower leg. __ reps per set, ___ sets per day, ___ days per week. Lower like elevator, stopping at each floor. Add ___ lbs when you achieve ___ repetitions. Rest on elbows. Rest on straight arms.  ABDUCTION: Side-Lying (Active)   Lie on left side, top leg straight. Raise top leg as far  as possible. Use ___ lbs. Complete ___ sets of ___ repetitions. Perform ___ sessions per day.  http://gtsc.exer.us/94   (Home) Extension: Hip   With support under abdomen, tighten stomach. Lift right leg in line with body. Do not hyperextend. Alternate legs. Repeat ____ times per set. Do ____ sets per session. Do ____ sessions per week.  ADDUCTION: Side-Lying (Active)   Lie on right side, with top leg bent and in front of other leg. Lift straight leg up as high as possible. Use ___ lbs. Complete ___ sets of ___ repetitions. Perform ___ sessions per day.  http://gtsc.exer.us/129   Copyright  VHI. All rights reserved.   Gail Daniels, PT 12/31/2014 10:23 AM [facility] [address] [place], [place], [address] Phone: [phone]   Fax:  [phone]

## 2015-01-05 ENCOUNTER — Encounter: Payer: Self-pay | Admitting: Physical Therapy

## 2015-01-05 ENCOUNTER — Ambulatory Visit: Payer: Self-pay | Admitting: Physical Therapy

## 2015-01-05 DIAGNOSIS — M25671 Stiffness of right ankle, not elsewhere classified: Secondary | ICD-10-CM

## 2015-01-05 DIAGNOSIS — S82891A Other fracture of right lower leg, initial encounter for closed fracture: Secondary | ICD-10-CM

## 2015-01-05 DIAGNOSIS — M25571 Pain in right ankle and joints of right foot: Secondary | ICD-10-CM

## 2015-01-05 NOTE — Therapy (Signed)
[facility] [address] [place], [place], [address] Phone: [phone]   Fax:  [phone]  Physical Therapy Treatment  Patient Details  Name: Gail Daniels MRN: [id number] Date of Birth: 03/30/1966 Referring Provider:  Junie Spencer, FNP  Encounter Date: 01/05/2015      PT End of Session - 01/05/15 0946    Visit Number 3   Number of Visits 24   Date for PT Re-Evaluation 03/23/15   PT Start Time 0945   PT Stop Time 1023   PT Time Calculation (min) 38 min   Activity Tolerance Patient tolerated treatment well   Behavior During Therapy [facility] for tasks assessed/performed      Past Medical History  Diagnosis Date  . Adult ADHD   . Hypertension   . GERD (gastroesophageal reflux disease)     Past Surgical History  Procedure Laterality Date  . Cesarean section    . Tubal ligation      There were no vitals filed for this visit.  Visit Diagnosis:  Stiffness of ankle joint, right  Right ankle pain  Ankle fracture, right      Subjective Assessment - 01/05/15 0945    Subjective Reports that ankle feels the same as before. Reports HEP compliance. Reports getting aggravated at home trying to do things around her home and gets fatigued easily. Reports that she was only given clearance to weightbear on R ankle prior to last week's therapy.   How long can you walk comfortably? Cannot walk on right foot yet.  NWBing with rolling walker.   Currently in Pain? No/denies            [facility] PT Assessment - 01/05/15 0001    Assessment   Medical Diagnosis Right ankle fracture.   Onset Date/Surgical Date 11/13/14   Next MD Visit 03/30/2015                     [facility] Adult PT Treatment/Exercise - 01/05/15 0001    Knee/Hip Exercises: Stretches   Gastroc Stretch --   Knee/Hip Exercises: Supine   Straight Leg Raises Strengthening;Right;20 reps   Knee/Hip Exercises: Sidelying   Hip ABduction Strengthening;Right;2 sets;10  reps   Hip ADduction Strengthening;Right;20 reps   Knee/Hip Exercises: Prone   Hip Extension Strengthening;Right;20 reps   Modalities   Modalities Vasopneumatic   Vasopneumatic   Number Minutes Vasopneumatic  15 minutes   Vasopnuematic Location  Ankle   Vasopneumatic Pressure Low   Vasopneumatic Temperature  44   Ankle Exercises: Stretches   Gastroc Stretch 3 reps;30 seconds   Ankle Exercises: Seated   ABC's 2 reps   Ankle Circles/Pumps AROM;Other reps (comment)  x30 reps pumps; x30 reps circles   Towel Inversion/Eversion Other (comment)  x20 reps   Heel Raises 20 reps   Toe Raise 20 reps   Heel Slides Right;Other (comment)  x25 reps   Other Seated Ankle Exercises Rockerboard x10 with 30 second hold   Ankle Exercises: Standing   Other Standing Ankle Exercises Weightshift L/R x15 reps, heel/toe x10 reps  Reported pain with both weightshift exercises.                     PT Long Term Goals - 01/05/15 0950    PT LONG TERM GOAL #1   Title Ind with advanced HEP.   Time 12   Period Weeks   Status Achieved   PT LONG TERM GOAL #2   Title Increase right ankle  dorsiflexion to 6- 8 degrees to normalize the patient's gait pattern   Time 12   Period Weeks   Status On-going   PT LONG TERM GOAL #3   Title Increase right ankle strength to 4+ to 5/5 to increase stability for functional tasks   Time 12   Period Weeks   Status On-going   PT LONG TERM GOAL #4   Title Walk a community distance with no gait deviation and pain not > 3/10.   Time 12   Period Weeks   Status On-going   PT LONG TERM GOAL #5   Title Perform ADL's with pain not > 3/10.   Status On-going               Plan - 01/05/15 1012    Clinical Impression Statement Patient tolerated treatment well today with pain only verbalized during standing weightshifting exercises. Ambulated into therapy gym using rollator, CAM boot but no weightbearing on RLE. Demonstrated good form and proper technique of  R hip exercises. Reported 6/10 pain folllowing all of therapeutic exercises. Normal vasopneumatic response noted following removal of the vasopneumatic system. Denied pain following treatment and used rollator, boot and continued to no weightbear on RLE when leaving therapy gym.   Pt will benefit from skilled therapeutic intervention in order to improve on the following deficits Abnormal gait;Decreased activity tolerance;Decreased strength;Difficulty walking;Increased edema;Pain;Decreased range of motion   Rehab Potential Good   PT Frequency 2x / week   PT Duration 12 weeks   PT Treatment/Interventions ADLs/Self Care Home Management;Cryotherapy;Radiation protection practitioner;Therapeutic activities;Therapeutic exercise;Neuromuscular re-education;Patient/family education;Manual techniques;Passive range of motion;Vasopneumatic Device   PT Next Visit Plan MD order:  Progress to WBAT to right ankle.  Slowly wean from boot as tolerated.  Work on ROM an dstrengthening of right ankle.  Continue gentle ROM.  Seated rockerboeard would be helpful.   Consulted and Agree with Plan of Care Patient        Problem List Patient Active Problem List   Diagnosis Date Noted  . Current smoker 12/17/2014  . Hyperlipemia 07/03/2014  . Vitamin D deficiency 07/03/2014  . Menopause 07/02/2014  . 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 GERD 06/25/2010    Evelene Croon, PTA 01/05/2015, 10:38 AM  [facility] [address] [place], [place], [address] Phone: [phone]   Fax:  [phone]

## 2015-01-08 ENCOUNTER — Encounter: Payer: Medicaid Other | Admitting: Physical Therapy

## 2015-01-12 ENCOUNTER — Other Ambulatory Visit: Payer: Self-pay | Admitting: Family

## 2015-01-12 ENCOUNTER — Encounter: Payer: Self-pay | Admitting: Family

## 2015-01-12 ENCOUNTER — Encounter: Payer: Self-pay | Admitting: Physical Therapy

## 2015-01-12 ENCOUNTER — Telehealth: Payer: Self-pay | Admitting: *Deleted

## 2015-01-12 ENCOUNTER — Ambulatory Visit (INDEPENDENT_AMBULATORY_CARE_PROVIDER_SITE_OTHER): Payer: 59 | Admitting: Family

## 2015-01-12 ENCOUNTER — Ambulatory Visit: Payer: Self-pay | Attending: Orthopedic Surgery | Admitting: Physical Therapy

## 2015-01-12 VITALS — BP 108/68 | HR 89 | Temp 97.7°F | Ht 65.0 in | Wt 126.2 lb

## 2015-01-12 DIAGNOSIS — N9489 Other specified conditions associated with female genital organs and menstrual cycle: Secondary | ICD-10-CM | POA: Diagnosis not present

## 2015-01-12 DIAGNOSIS — S82891A Other fracture of right lower leg, initial encounter for closed fracture: Secondary | ICD-10-CM | POA: Insufficient documentation

## 2015-01-12 DIAGNOSIS — S90912A Unspecified superficial injury of left ankle, initial encounter: Secondary | ICD-10-CM | POA: Diagnosis not present

## 2015-01-12 DIAGNOSIS — M25671 Stiffness of right ankle, not elsewhere classified: Secondary | ICD-10-CM | POA: Insufficient documentation

## 2015-01-12 DIAGNOSIS — F411 Generalized anxiety disorder: Secondary | ICD-10-CM

## 2015-01-12 DIAGNOSIS — K219 Gastro-esophageal reflux disease without esophagitis: Secondary | ICD-10-CM

## 2015-01-12 DIAGNOSIS — F329 Major depressive disorder, single episode, unspecified: Secondary | ICD-10-CM | POA: Diagnosis not present

## 2015-01-12 DIAGNOSIS — W57XXXA Bitten or stung by nonvenomous insect and other nonvenomous arthropods, initial encounter: Secondary | ICD-10-CM

## 2015-01-12 DIAGNOSIS — F32A Depression, unspecified: Secondary | ICD-10-CM

## 2015-01-12 DIAGNOSIS — N898 Other specified noninflammatory disorders of vagina: Secondary | ICD-10-CM

## 2015-01-12 DIAGNOSIS — M25571 Pain in right ankle and joints of right foot: Secondary | ICD-10-CM | POA: Insufficient documentation

## 2015-01-12 LAB — POCT WET PREP (WET MOUNT)

## 2015-01-12 MED ORDER — METRONIDAZOLE 500 MG PO TABS: 500.0000 mg | ORAL_TABLET | Freq: Two times a day (BID) | ORAL | Status: DC

## 2015-01-12 MED ORDER — ESCITALOPRAM OXALATE 20 MG PO TABS: 20.0000 mg | ORAL_TABLET | Freq: Every day | ORAL | Status: AC

## 2015-01-12 MED ORDER — DOXYCYCLINE HYCLATE 100 MG PO TABS: 100.0000 mg | ORAL_TABLET | Freq: Two times a day (BID) | ORAL | Status: DC

## 2015-01-12 MED ORDER — PANTOPRAZOLE SODIUM 40 MG PO TBEC: 40.0000 mg | DELAYED_RELEASE_TABLET | Freq: Every day | ORAL | Status: DC

## 2015-01-12 NOTE — Telephone Encounter (Signed)
-----   Message from Junie Spencer, FNP sent at 01/12/2015  2:05 PM EDT ----- Mellody Drown prep positive for BV- Antibiotic sent to pharmacy

## 2015-01-12 NOTE — Telephone Encounter (Signed)
Patient aware.

## 2015-01-12 NOTE — Patient Instructions (Signed)

## 2015-01-12 NOTE — Progress Notes (Signed)
Subjective:    Patient ID: Gail Daniels, female    DOB: 01-Mar-1966, 49 y.o.   MRN: [id number]  Pt presents to the office today for insect bite on her left ankle. Pt states she noticed it Friday, but states it has become worse. Pt states yesterday she noticed it was "very red" and pt states it is painful and itching. Pt denies taking anything for it with no relief.  Vaginal Discharge The patient's primary symptoms include a genital odor and vaginal discharge. This is a new problem. The current episode started 1 to 4 weeks ago. The problem occurs intermittently. The problem has been waxing and waning. The patient is experiencing no pain. Pertinent negatives include no anorexia, chills, diarrhea, fever, frequency, headaches, hematuria, nausea, urgency or vomiting. The vaginal discharge was normal.  Gastrophageal Reflux She complains of chest pain and heartburn. She reports no nausea or no tooth decay. This is a chronic problem. The current episode started more than 1 year ago. The problem occurs frequently. The problem has been waxing and waning. The heartburn wakes her from sleep. The symptoms are aggravated by certain foods and lying down. She has tried a PPI for the symptoms. The treatment provided mild relief.  Anxiety Presents for follow-up visit. Onset was 1 to 6 months ago. Symptoms include chest pain, excessive worry, insomnia, irritability, nervous/anxious behavior and panic. Patient reports no nausea, palpitations or shortness of breath. Symptoms occur most days. The symptoms are aggravated by family issues. The quality of sleep is fair.   Her past medical history is significant for anxiety/panic attacks and depression. Past treatments include SSRIs.      Review of Systems  Constitutional: Positive for irritability. Negative for fever and chills.  HENT: Negative.   Eyes: Negative.   Respiratory: Negative.  Negative for shortness of breath.   Cardiovascular: Positive for chest pain.  Negative for palpitations.  Gastrointestinal: Positive for heartburn. Negative for nausea, vomiting, diarrhea and anorexia.  Endocrine: Negative.   Genitourinary: Positive for vaginal discharge. Negative for urgency, frequency and hematuria.  Musculoskeletal: Negative.   Neurological: Negative.  Negative for headaches.  Hematological: Negative.   Psychiatric/Behavioral: The patient is nervous/anxious and has insomnia.   All other systems reviewed and are negative.      Objective:   Physical Exam  Constitutional: She is oriented to person, place, and time. She appears well-developed and well-nourished. No distress.  HENT:  Head: Normocephalic and atraumatic.  Eyes: Pupils are equal, round, and reactive to light.  Neck: Normal range of motion. Neck supple. No thyromegaly present.  Cardiovascular: Normal rate, regular rhythm, normal heart sounds and intact distal pulses.   No murmur heard. Pulmonary/Chest: Effort normal and breath sounds normal. No respiratory distress. She has no wheezes.  Abdominal: Soft. Bowel sounds are normal. She exhibits no distension. There is no tenderness.  Musculoskeletal: Normal range of motion. She exhibits no edema or tenderness.  Neurological: She is alert and oriented to person, place, and time. She has normal reflexes. No cranial nerve deficit.  Skin: Skin is warm and dry. Rash noted. There is erythema.  Erythemas rash 3.2 cmX 1 cm with warmth   Psychiatric: She has a normal mood and affect. Her behavior is normal. Judgment and thought content normal.  Vitals reviewed.   BP 108/68 mmHg  Pulse 89  Temp(Src) 97.7 F (36.5 C) (Oral)  Ht 5\' 5"  (1.651 m)  Wt 126 lb 3.2 oz (57.244 kg)  BMI 21.00 kg/m2  Assessment & Plan:  1. Insect bite --Pt to report any new fever, redness, or rash -Wear protective clothing while outside- Long sleeves and long pants -Put insect repellent on all exposed skin and along clothing - doxycycline (VIBRA-TABS)  100 MG tablet; Take 1 tablet (100 mg total) by mouth 2 (two) times daily.  Dispense: 20 tablet; Refill: 0  2. Vaginal odor - POCT Wet Prep [facility])  3. Gastroesophageal reflux disease, esophagitis presence not specified -Protonix increased to 40 mg from 20 mg - pantoprazole (PROTONIX) 40 MG tablet; Take 1 tablet (40 mg total) by mouth daily.  Dispense: 30 tablet; Refill: 3  4. Depression -Lexapro increased to 20 from 10 mg - escitalopram (LEXAPRO) 20 MG tablet; Take 1 tablet (20 mg total) by mouth daily.  Dispense: 90 tablet; Refill: 2  5. GAD (generalized anxiety disorder) - escitalopram (LEXAPRO) 20 MG tablet; Take 1 tablet (20 mg total) by mouth daily.  Dispense: 90 tablet; Refill: 2   Continue all meds Labs pending Health Maintenance reviewed Diet and exercise encouraged RTO 6 weeks for GAD and Depression  Jannifer Rodney, FNP

## 2015-01-12 NOTE — Therapy (Signed)
[facility] [address] [place], [place], [address] Phone: [phone]   Fax:  [phone]  Physical Therapy Treatment  Patient Details  Name: Gail Daniels MRN: [id number] Date of Birth: 1965/06/26 Referring Provider:  Junie Spencer, FNP  Encounter Date: 01/12/2015      PT End of Session - 01/12/15 0948    Visit Number 4   Number of Visits 24   Date for PT Re-Evaluation 03/23/15   PT Start Time 0946   PT Stop Time 1027   PT Time Calculation (min) 41 min   Activity Tolerance Patient tolerated treatment well   Behavior During Therapy [facility] for tasks assessed/performed      Past Medical History  Diagnosis Date  . Adult ADHD   . Hypertension   . GERD (gastroesophageal reflux disease)     Past Surgical History  Procedure Laterality Date  . Cesarean section    . Tubal ligation      There were no vitals filed for this visit.  Visit Diagnosis:  Stiffness of ankle joint, right  Right ankle pain  Ankle fracture, right      Subjective Assessment - 01/12/15 0947    Subjective Reports that ankle gets better everyday. Reports that she may have a spider bite on the L medial lower leg and is going to MD following treatment.   How long can you walk comfortably? Cannot walk on right foot yet.  NWBing with rolling walker.   Currently in Pain? Yes   Pain Score 7    Pain Location Ankle   Pain Orientation Right   Pain Descriptors / Indicators Aching   Pain Onset More than a month ago            [facility] PT Assessment - 01/12/15 0001    Assessment   Medical Diagnosis Right ankle fracture.   Onset Date/Surgical Date 11/13/14   Next MD Visit 03/30/2015                     [facility] Adult PT Treatment/Exercise - 01/12/15 0001    Knee/Hip Exercises: Supine   Straight Leg Raises Strengthening;Right;3 sets;10 reps   Knee/Hip Exercises: Sidelying   Hip ABduction Strengthening;Right;3 sets;10 reps   Hip ADduction  Strengthening;Right;3 sets;10 reps   Knee/Hip Exercises: Prone   Hip Extension Strengthening;Right;3 sets;10 reps   Modalities   Modalities Vasopneumatic   Vasopneumatic   Number Minutes Vasopneumatic  15 minutes   Vasopnuematic Location  Ankle   Vasopneumatic Pressure Medium   Vasopneumatic Temperature  42   Ankle Exercises: Stretches   Soleus Stretch 3 reps;30 seconds   Gastroc Stretch 3 reps;30 seconds   Ankle Exercises: Seated   ABC's 2 reps   Ankle Circles/Pumps AROM;Other reps (comment)  30 pumps; 30 circles CW, CCW   Towel Inversion/Eversion Other (comment)  x30 reps   Heel Raises 20 reps   Toe Raise 20 reps   Heel Slides Right;Other (comment)  3x10 reps   Other Seated Ankle Exercises Rockerboard x41min   Ankle Exercises: Standing   Other Standing Ankle Exercises Weightshift L/R x reps, heel/toe x10 reps  Reported uncomfortable but no as bad as before                     PT Long Term Goals - 01/05/15 0950    PT LONG TERM GOAL #1   Title Ind with advanced HEP.   Time 12   Period Weeks   Status Achieved  PT LONG TERM GOAL #2   Title Increase right ankle dorsiflexion to 6- 8 degrees to normalize the patient's gait pattern   Time 12   Period Weeks   Status On-going   PT LONG TERM GOAL #3   Title Increase right ankle strength to 4+ to 5/5 to increase stability for functional tasks   Time 12   Period Weeks   Status On-going   PT LONG TERM GOAL #4   Title Walk a community distance with no gait deviation and pain not > 3/10.   Time 12   Period Weeks   Status On-going   PT LONG TERM GOAL #5   Title Perform ADL's with pain not > 3/10.   Status On-going               Plan - 01/12/15 1015    Clinical Impression Statement Patient tolerated treatment well today with a reported decrease in uncomfortable feeling in R ankle during weightshifting activities. Ambulated into therayp gym today using rollator and CAM boot but was not weightbearing on the  RLE. Continues to demonstrate good form and technique for the hip strengthening exercises. Normal vasopneumatic response noted following removal of the vasopneumatic system. Denied pain following treatment and left therapy gym using rollator, CAM boot and was not weightbearing on RLE.    Pt will benefit from skilled therapeutic intervention in order to improve on the following deficits Abnormal gait;Decreased activity tolerance;Decreased strength;Difficulty walking;Increased edema;Pain;Decreased range of motion   Rehab Potential Good   PT Frequency 2x / week   PT Duration 12 weeks   PT Treatment/Interventions ADLs/Self Care Home Management;Cryotherapy;Radiation protection practitioner;Therapeutic activities;Therapeutic exercise;Neuromuscular re-education;Patient/family education;Manual techniques;Passive range of motion;Vasopneumatic Device   PT Next Visit Plan MD order:  Progress to WBAT to right ankle.  Slowly wean from boot as tolerated.  Work on ROM an dstrengthening of right ankle.  Continue gentle ROM.  Seated rockerboeard would be helpful.   Consulted and Agree with Plan of Care Patient        Problem List Patient Active Problem List   Diagnosis Date Noted  . Current smoker 12/17/2014  . Hyperlipemia 07/03/2014  . Vitamin D deficiency 07/03/2014  . Menopause 07/02/2014  . 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 GERD 06/25/2010    Evelene Croon, PTA 01/12/2015, 10:33 AM  [facility] [address] [place], [place], [address] Phone: [phone]   Fax:  [phone]

## 2015-01-15 ENCOUNTER — Ambulatory Visit: Payer: Self-pay | Admitting: Physical Therapy

## 2015-01-15 ENCOUNTER — Encounter: Payer: Self-pay | Admitting: Physical Therapy

## 2015-01-15 DIAGNOSIS — M25571 Pain in right ankle and joints of right foot: Secondary | ICD-10-CM

## 2015-01-15 DIAGNOSIS — S82891A Other fracture of right lower leg, initial encounter for closed fracture: Secondary | ICD-10-CM

## 2015-01-15 DIAGNOSIS — M25671 Stiffness of right ankle, not elsewhere classified: Secondary | ICD-10-CM

## 2015-01-15 NOTE — Therapy (Signed)
[facility] [address] [place], [place], [address] Phone: [phone]   Fax:  [phone]  Physical Therapy Treatment  Patient Details  Name: Gail Daniels MRN: [id number] Date of Birth: [birth year] Referring Provider:  Junie Spencer, FNP  Encounter Date: 01/15/2015      PT End of Session - 01/15/15 0945    Visit Number 5   Number of Visits 24   Date for PT Re-Evaluation 03/23/15   PT Start Time 0943   PT Stop Time 1039   PT Time Calculation (min) 56 min   Activity Tolerance Patient tolerated treatment well   Behavior During Therapy [facility] for tasks assessed/performed      Past Medical History  Diagnosis Date  . Adult ADHD   . Hypertension   . GERD (gastroesophageal reflux disease)     Past Surgical History  Procedure Laterality Date  . Cesarean section    . Tubal ligation    . Ankle surgery Right     There were no vitals filed for this visit.  Visit Diagnosis:  Stiffness of ankle joint, right  Right ankle pain  Ankle fracture, right      Subjective Assessment - 01/15/15 0944    Subjective Reports that ankle is feeling pretty good. Cannot put full weight on R ankle only a little bit. MD said red place on medial L lower leg was an insect bite and was given an antibiotic.   How long can you walk comfortably? Cannot walk on right foot yet.  NWBing with rolling walker.   Currently in Pain? No/denies            [facility] PT Assessment - 01/15/15 0001    Assessment   Medical Diagnosis Right ankle fracture.   Onset Date/Surgical Date 11/13/14   Next MD Visit 03/30/2015                     [facility] Adult PT Treatment/Exercise - 01/15/15 0001    Ambulation/Gait   Ambulation/Gait Yes   Ambulation/Gait Assistance 6: Modified independent (Device/Increase time)   Ambulation Distance (Feet) 240 Feet   Assistive device Rollator   Gait Pattern Step-through pattern;Decreased stance time - right;Right hip hike   Slight R hip hike to advance CAM boot   Ambulation Surface Level;Indoor   Gait Comments Patient ambulated with normal R knee flexion, normal heel/toe with slight R hip hike to advance the boot; Required cueing to relax B shoulders and to normalize R heel/toe; Experienced pain in the R Achilles area and anterior to R lateral malleoli where notable swelling and knot is present.    Modalities   Modalities Vasopneumatic   Vasopneumatic   Number Minutes Vasopneumatic  15 minutes   Vasopnuematic Location  Ankle   Vasopneumatic Pressure Medium   Vasopneumatic Temperature  45   Ankle Exercises: Stretches   Soleus Stretch 3 reps;30 seconds   Gastroc Stretch 3 reps;30 seconds   Ankle Exercises: Seated   ABC's 2 reps   Ankle Circles/Pumps AROM;Other reps (comment)  x30 reps each   Towel Inversion/Eversion Other (comment)  x30 reps with reports of tightness medially with eversion   Heel Raises --  x30 reps   Toe Raise --  x30 reps   Heel Slides Right;Other (comment)  x30 reps   Other Seated Ankle Exercises Rockerboard x58min   Ankle Exercises: Standing   Other Standing Ankle Exercises Weightshift L/R x2 min, heel/toe x2 min  PT Long Term Goals - 01/05/15 0950    PT LONG TERM GOAL #1   Title Ind with advanced HEP.   Time 12   Period Weeks   Status Achieved   PT LONG TERM GOAL #2   Title Increase right ankle dorsiflexion to 6- 8 degrees to normalize the patient's gait pattern   Time 12   Period Weeks   Status On-going   PT LONG TERM GOAL #3   Title Increase right ankle strength to 4+ to 5/5 to increase stability for functional tasks   Time 12   Period Weeks   Status On-going   PT LONG TERM GOAL #4   Title Walk a community distance with no gait deviation and pain not > 3/10.   Time 12   Period Weeks   Status On-going   PT LONG TERM GOAL #5   Title Perform ADL's with pain not > 3/10.   Status On-going               Plan - 01/15/15 1057     Clinical Impression Statement Patient tolerated treatment well today with exercises. Had a semi-firm knot anterior to the R lateral malleoli. Experienced tightness in the medial R ankle during eversion and tightness where the lateral knot is. Ambulates safely and at an appropriate speed with rollator and boot. Was able to tolerate 240 ft with rollator and boot but stopped due to R Achilles pain and pain where the lateral knot was. Normal modalities response noted following removal of the modalties.    Pt will benefit from skilled therapeutic intervention in order to improve on the following deficits Abnormal gait;Decreased activity tolerance;Decreased strength;Difficulty walking;Increased edema;Pain;Decreased range of motion   Rehab Potential Good   PT Frequency 2x / week   PT Duration 12 weeks   PT Treatment/Interventions ADLs/Self Care Home Management;Cryotherapy;Radiation protection practitioner;Therapeutic activities;Therapeutic exercise;Neuromuscular re-education;Patient/family education;Manual techniques;Passive range of motion;Vasopneumatic Device   PT Next Visit Plan MD order:  Progress to WBAT to right ankle.  Slowly wean from boot as tolerated.  Work on ROM an dstrengthening of right ankle.  Continue gentle ROM.  Seated rockerboeard would be helpful.   Consulted and Agree with Plan of Care Patient        Problem List Patient Active Problem List   Diagnosis Date Noted  . Depression 01/12/2015  . GAD (generalized anxiety disorder) 01/12/2015  . Current smoker 12/17/2014  . Hyperlipemia 07/03/2014  . Vitamin D deficiency 07/03/2014  . Menopause 07/02/2014  . 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 GERD 06/25/2010    Evelene Croon, PTA 01/15/2015, 11:43 AM  [facility] [address] [place], [place], [address] Phone: [phone]   Fax:  [phone]

## 2015-01-18 ENCOUNTER — Other Ambulatory Visit: Payer: Self-pay | Admitting: Family

## 2015-01-20 ENCOUNTER — Ambulatory Visit: Payer: Self-pay | Admitting: Physical Therapy

## 2015-01-20 ENCOUNTER — Encounter: Payer: Self-pay | Admitting: Physical Therapy

## 2015-01-20 DIAGNOSIS — M25671 Stiffness of right ankle, not elsewhere classified: Secondary | ICD-10-CM

## 2015-01-20 DIAGNOSIS — M25571 Pain in right ankle and joints of right foot: Secondary | ICD-10-CM

## 2015-01-20 DIAGNOSIS — S82891A Other fracture of right lower leg, initial encounter for closed fracture: Secondary | ICD-10-CM

## 2015-01-20 NOTE — Therapy (Signed)
[facility] [place], [place], [address] Phone: [phone]   Fax:  [phone]  Physical Therapy Treatment  Patient Details  Name: Gail Daniels MRN: [id number] Date of Birth: [birth year] Referring Provider:  Sharion Balloon, FNP  Encounter Date: 01/20/2015      PT End of Session - 01/20/15 1025    Visit Number 6   Number of Visits 24   Date for PT Re-Evaluation 03/23/15   PT Start Time 0933   PT Stop Time 1031   PT Time Calculation (min) 58 min   Activity Tolerance Patient tolerated treatment well   Behavior During Therapy [facility] for tasks assessed/performed      Past Medical History  Diagnosis Date  . Adult ADHD   . Hypertension   . GERD (gastroesophageal reflux disease)     Past Surgical History  Procedure Laterality Date  . Cesarean section    . Tubal ligation    . Ankle surgery Right     There were no vitals filed for this visit.  Visit Diagnosis:  Stiffness of ankle joint, right  Right ankle pain  Ankle fracture, right      Subjective Assessment - 01/20/15 0939    Subjective no pain upon arrival yet up to 5/10 with activity   How long can you walk comfortably? Cannot walk on right foot yet.  NWBing with rolling walker.   Currently in Pain? Yes   Pain Score 5    Pain Location Ankle   Pain Orientation Right   Pain Descriptors / Indicators Aching   Pain Type Acute pain   Pain Onset More than a month ago   Aggravating Factors  movement of ankle   Pain Relieving Factors rest            [facility] PT Assessment - 01/20/15 0001    AROM   Overall AROM  Deficits   Overall AROM Comments AROM with knee bent 4 degrees                     [facility] Adult PT Treatment/Exercise - 01/20/15 0001    Knee/Hip Exercises: Aerobic   Nustep x56mn with UE/LE L1 ROM only   Modalities   Modalities Vasopneumatic   Vasopneumatic   Number Minutes Vasopneumatic  15 minutes   Vasopnuematic Location  Ankle    Vasopneumatic Pressure Medium   Manual Therapy   Manual Therapy Passive ROM   Passive ROM gentle low load holds for all ankle motions   Ankle Exercises: Stretches   Soleus Stretch 3 reps;30 seconds   Gastroc Stretch 3 reps;30 seconds   Ankle Exercises: Seated   Ankle Circles/Pumps AROM;Other reps (comment)  x30   Towel Inversion/Eversion --  x30   Heel Raises 20 reps   Toe Raise 20 reps   Other Seated Ankle Exercises Rockerboard x336m                     PT Long Term Goals - 01/05/15 0950    PT LONG TERM GOAL #1   Title Ind with advanced HEP.   Time 12   Period Weeks   Status Achieved   PT LONG TERM GOAL #2   Title Increase right ankle dorsiflexion to 6- 8 degrees to normalize the patient's gait pattern   Time 12   Period Weeks   Status On-going   PT LONG TERM GOAL #3   Title Increase right ankle strength to 4+ to 5/5 to  increase stability for functional tasks   Time 12   Period Weeks   Status On-going   PT LONG TERM GOAL #4   Title Walk a community distance with no gait deviation and pain not > 3/10.   Time 12   Period Weeks   Status On-going   PT LONG TERM GOAL #5   Title Perform ADL's with pain not > 3/10.   Status On-going               Plan - 01/20/15 1026    Clinical Impression Statement Patient is progressing with all activities. Has less pain overall and has improved AROM for right ankle DF with kne bent today. Tolerated nustep well today and was able to perform ankle ROM half range. No further goals met today due to pain, ROM and strength deficits.   Pt will benefit from skilled therapeutic intervention in order to improve on the following deficits Abnormal gait;Decreased activity tolerance;Decreased strength;Difficulty walking;Increased edema;Pain;Decreased range of motion   Rehab Potential Good   PT Frequency 2x / week   PT Duration 12 weeks   PT Treatment/Interventions ADLs/Self Care Home Management;Cryotherapy;Printmaker;Therapeutic activities;Therapeutic exercise;Neuromuscular re-education;Patient/family education;Manual techniques;Passive range of motion;Vasopneumatic Device   PT Next Visit Plan MD order:  Progress to WBAT to right ankle.  Slowly wean from boot as tolerated.  Work on ROM and strengthening of right ankle.  Continue gentle ROM with nustep if tolerated well and add prostretch next treatment   Consulted and Agree with Plan of Care Patient        Problem List Patient Active Problem List   Diagnosis Date Noted  . Depression 01/12/2015  . GAD (generalized anxiety disorder) 01/12/2015  . Current smoker 12/17/2014  . Hyperlipemia 07/03/2014  . Vitamin D deficiency 07/03/2014  . Menopause 07/02/2014  . 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 GERD 06/25/2010    Ruben Pyka P, PTA 01/20/2015, 10:31 AM  [facility] [address] [place], [place], [address] Phone: [phone]   Fax:  [phone]

## 2015-01-21 ENCOUNTER — Encounter: Payer: Self-pay | Admitting: Physical Therapy

## 2015-01-21 ENCOUNTER — Ambulatory Visit: Payer: Self-pay | Admitting: Physical Therapy

## 2015-01-21 DIAGNOSIS — M25671 Stiffness of right ankle, not elsewhere classified: Secondary | ICD-10-CM

## 2015-01-21 DIAGNOSIS — S82891A Other fracture of right lower leg, initial encounter for closed fracture: Secondary | ICD-10-CM

## 2015-01-21 DIAGNOSIS — M25571 Pain in right ankle and joints of right foot: Secondary | ICD-10-CM

## 2015-01-21 NOTE — Therapy (Signed)
[facility] [address] [place], [place], [address] Phone: [phone]   Fax:  [phone]  Physical Therapy Treatment  Patient Details  Name: Gail Daniels MRN: [id number] Date of Birth: 1966/03/26 Referring Provider:  Junie Spencer, FNP  Encounter Date: 01/21/2015      PT End of Session - 01/21/15 1349    Visit Number 7   Number of Visits 24   Date for PT Re-Evaluation 03/23/15   PT Start Time 1347   PT Stop Time 1428   PT Time Calculation (min) 41 min   Equipment Utilized During Treatment Other (comment)  R CAM boot, Rollator, sock   Activity Tolerance Patient tolerated treatment well   Behavior During Therapy [facility] for tasks assessed/performed      Past Medical History  Diagnosis Date  . Adult ADHD   . Hypertension   . GERD (gastroesophageal reflux disease)     Past Surgical History  Procedure Laterality Date  . Cesarean section    . Tubal ligation    . Ankle surgery Right     There were no vitals filed for this visit.  Visit Diagnosis:  Stiffness of ankle joint, right  Right ankle pain  Ankle fracture, right      Subjective Assessment - 01/21/15 1348    Subjective Reports ankle feeling about the same as before and did okay after initiating NuStep last treatment. Reports most pain at night and cannot sleep without sock due to sensitivity. Reports HEP compliance.    How long can you walk comfortably? Cannot walk on right foot yet.  NWBing with rolling walker.   Currently in Pain? Yes   Pain Score 3    Pain Location Ankle   Pain Orientation Right   Pain Descriptors / Indicators Tightness   Pain Type Acute pain   Pain Onset More than a month ago            [facility] PT Assessment - 01/21/15 0001    Assessment   Medical Diagnosis Right ankle fracture.   Onset Date/Surgical Date 11/13/14   Next MD Visit 03/30/2015                     [facility] Adult PT Treatment/Exercise - 01/21/15 0001    Knee/Hip Exercises: Aerobic   Nustep x64min with UE/LE L1 ROM only   Ankle Exercises: Seated   ABC's 2 reps   Ankle Circles/Pumps AROM;Other reps (comment)  x30 reps each of circles and pumps   Towel Inversion/Eversion Other (comment)  x30 reps   Heel Raises Other (comment)  x30 reps   Toe Raise Other (comment)  x30 reps   BAPS Sitting;Level 2;Other (comment)  12-6 x30 reps, 3-9 x30 reps, circles x30 reps   Other Seated Ankle Exercises Rockerboard x12min   Ankle Exercises: Stretches   Gastroc Stretch 3 reps;30 seconds                     PT Long Term Goals - 01/05/15 0950    PT LONG TERM GOAL #1   Title Ind with advanced HEP.   Time 12   Period Weeks   Status Achieved   PT LONG TERM GOAL #2   Title Increase right ankle dorsiflexion to 6- 8 degrees to normalize the patient's gait pattern   Time 12   Period Weeks   Status On-going   PT LONG TERM GOAL #3   Title Increase right ankle strength to 4+ to 5/5  to increase stability for functional tasks   Time 12   Period Weeks   Status On-going   PT LONG TERM GOAL #4   Title Walk a community distance with no gait deviation and pain not > 3/10.   Time 12   Period Weeks   Status On-going   PT LONG TERM GOAL #5   Title Perform ADL's with pain not > 3/10.   Status On-going               Plan - 01/21/15 1429    Clinical Impression Statement Patient continues to progress with ankle activities without complaint of increased pain. Continues to tolerate NuStep well without complaint of pain. No goals were achieved today secondary to pain, ROM, and strength deficits. Continues to have firm area anterior to R lateral malleoli and swelling present in R foot into lower leg. Lacks R ankle control secondary to difficulty with control during seated BAPS exercises.Denied pain following treatment.   Pt will benefit from skilled therapeutic intervention in order to improve on the following deficits Abnormal gait;Decreased  activity tolerance;Decreased strength;Difficulty walking;Increased edema;Pain;Decreased range of motion   Rehab Potential Good   PT Frequency 2x / week   PT Duration 12 weeks   PT Treatment/Interventions ADLs/Self Care Home Management;Cryotherapy;Radiation protection practitioner;Therapeutic activities;Therapeutic exercise;Neuromuscular re-education;Patient/family education;Manual techniques;Passive range of motion;Vasopneumatic Device   PT Next Visit Plan MD order:  Progress to WBAT to right ankle.  Slowly wean from boot as tolerated.  Work on ROM and strengthening of right ankle.  Continue gentle ROM with nustep if tolerated well and add prostretch next treatment   Consulted and Agree with Plan of Care Patient        Problem List Patient Active Problem List   Diagnosis Date Noted  . Depression 01/12/2015  . GAD (generalized anxiety disorder) 01/12/2015  . Current smoker 12/17/2014  . Hyperlipemia 07/03/2014  . Vitamin D deficiency 07/03/2014  . Menopause 07/02/2014  . 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 GERD 06/25/2010    Evelene Croon, PTA 01/21/2015, 2:32 PM  [facility] [address] [place], [place], [address] Phone: [phone]   Fax:  [phone]

## 2015-01-26 ENCOUNTER — Ambulatory Visit: Payer: Self-pay | Admitting: Physical Therapy

## 2015-01-29 ENCOUNTER — Encounter: Payer: 59 | Admitting: Physical Therapy

## 2015-02-02 ENCOUNTER — Encounter: Payer: Self-pay | Admitting: Physical Therapy

## 2015-02-02 ENCOUNTER — Ambulatory Visit: Payer: Self-pay | Admitting: Physical Therapy

## 2015-02-02 DIAGNOSIS — M25671 Stiffness of right ankle, not elsewhere classified: Secondary | ICD-10-CM

## 2015-02-02 DIAGNOSIS — S82891A Other fracture of right lower leg, initial encounter for closed fracture: Secondary | ICD-10-CM

## 2015-02-02 DIAGNOSIS — M25571 Pain in right ankle and joints of right foot: Secondary | ICD-10-CM

## 2015-02-02 NOTE — Therapy (Signed)
[facility] [address] [place], [place], [address] Phone: [phone]   Fax:  [phone]  Physical Therapy Treatment  Patient Details  Name: Gail Daniels MRN: [id number] Date of Birth: 1965/07/19 Referring Provider:  Junie Spencer, FNP  Encounter Date: 02/02/2015      PT End of Session - 02/02/15 1031    Visit Number 8   Number of Visits 24   Date for PT Re-Evaluation 03/23/15   PT Start Time 0945   PT Stop Time 1029   PT Time Calculation (min) 44 min   Activity Tolerance Patient tolerated treatment well   Behavior During Therapy [facility] for tasks assessed/performed      Past Medical History  Diagnosis Date  . Adult ADHD   . Hypertension   . GERD (gastroesophageal reflux disease)     Past Surgical History  Procedure Laterality Date  . Cesarean section    . Tubal ligation    . Ankle surgery Right     There were no vitals filed for this visit.  Visit Diagnosis:  Stiffness of ankle joint, right  Right ankle pain  Ankle fracture, right      Subjective Assessment - 02/02/15 0951    Subjective Patient arrive with no assistive device toaday and no pain    Currently in Pain? No/denies            [facility] PT Assessment - 02/02/15 0001    AROM   Overall AROM  Deficits   Overall AROM Comments AROM with knee bent 5 DF degrees                     [facility] Adult PT Treatment/Exercise - 02/02/15 0001    Knee/Hip Exercises: Aerobic   Nustep with focus on rom/strength   Knee/Hip Exercises: Standing   Lateral Step Up Right;3 sets;10 reps;Step Height: 6"   Forward Step Up Right;3 sets;10 reps;Step Height: 6"   Rocker Board --   Other Standing Knee Exercises dyna dics circles 2x10   Manual Therapy   Manual Therapy Passive ROM   Passive ROM gentle low load holds for all ankle motions   Ankle Exercises: Seated   Other Seated Ankle Exercises Ankle isolator DF/ circles 1# 3x10 each   Other Seated Ankle  Exercises prostretch x66min for DF   Ankle Exercises: Standing   Rocker Board 3 minutes                     PT Long Term Goals - 01/05/15 0950    PT LONG TERM GOAL #1   Title Ind with advanced HEP.   Time 12   Period Weeks   Status Achieved   PT LONG TERM GOAL #2   Title Increase right ankle dorsiflexion to 6- 8 degrees to normalize the patient's gait pattern   Time 12   Period Weeks   Status On-going   PT LONG TERM GOAL #3   Title Increase right ankle strength to 4+ to 5/5 to increase stability for functional tasks   Time 12   Period Weeks   Status On-going   PT LONG TERM GOAL #4   Title Walk a community distance with no gait deviation and pain not > 3/10.   Time 12   Period Weeks   Status On-going   PT LONG TERM GOAL #5   Title Perform ADL's with pain not > 3/10.   Status On-going  Plan - 02/02/15 1033    Clinical Impression Statement Patient continues to progress with all activities. Patient initiated standing exercises with no pain reports and has improved AROM for right DF today. Patient feels occational popping in ankle but no pain and has been walking without CAM boot some. Goals ongoing due to ROM, strength and gait deviation.   Pt will benefit from skilled therapeutic intervention in order to improve on the following deficits Abnormal gait;Decreased activity tolerance;Decreased strength;Difficulty walking;Increased edema;Pain;Decreased range of motion   Rehab Potential Good   PT Frequency 2x / week   PT Duration 12 weeks   PT Treatment/Interventions ADLs/Self Care Home Management;Cryotherapy;Radiation protection practitioner;Therapeutic activities;Therapeutic exercise;Neuromuscular re-education;Patient/family education;Manual techniques;Passive range of motion;Vasopneumatic Device   PT Next Visit Plan cont with POC and disscuss with MPT about elliptical per patient request   Consulted and Agree  with Plan of Care Patient        Problem List Patient Active Problem List   Diagnosis Date Noted  . Depression 01/12/2015  . GAD (generalized anxiety disorder) 01/12/2015  . Current smoker 12/17/2014  . Hyperlipemia 07/03/2014  . Vitamin D deficiency 07/03/2014  . Menopause 07/02/2014  . 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 GERD 06/25/2010    Hermelinda Dellen, PTA 02/02/2015, 10:39 AM  [facility] [address] [place], [place], [address] Phone: [phone]   Fax:  [phone]

## 2015-02-04 ENCOUNTER — Encounter: Payer: 59 | Admitting: Physical Therapy

## 2015-02-05 ENCOUNTER — Ambulatory Visit: Payer: Self-pay | Admitting: Physical Therapy

## 2015-02-05 DIAGNOSIS — M25671 Stiffness of right ankle, not elsewhere classified: Secondary | ICD-10-CM

## 2015-02-05 DIAGNOSIS — S82891A Other fracture of right lower leg, initial encounter for closed fracture: Secondary | ICD-10-CM

## 2015-02-05 NOTE — Therapy (Signed)
[facility] [place], [place], [address] Phone: [phone]   Fax:  [phone]  Physical Therapy Treatment  Patient Details  Name: Gail Daniels MRN: [id number] Date of Birth: 08-14-1965 Referring Provider:  Sharion Balloon, FNP  Encounter Date: 02/05/2015      PT End of Session - 02/05/15 1034    Visit Number 9   Number of Visits 24   Date for PT Re-Evaluation 03/23/15   PT Start Time 1032   PT Stop Time 1120   PT Time Calculation (min) 48 min   Activity Tolerance Patient tolerated treatment well   Behavior During Therapy [facility] for tasks assessed/performed      Past Medical History  Diagnosis Date  . Adult ADHD   . Hypertension   . GERD (gastroesophageal reflux disease)     Past Surgical History  Procedure Laterality Date  . Cesarean section    . Tubal ligation    . Ankle surgery Right     There were no vitals filed for this visit.  Visit Diagnosis:  Stiffness of ankle joint, right  Ankle fracture, right      Subjective Assessment - 02/05/15 1042    Subjective Patient continues to walk in CAM boot. She was supposed to bring shoe today, but forgot it. Her main complaint is plantar flexion.    Currently in Pain? No/denies            [facility] PT Assessment - 02/05/15 0001    Assessment   Medical Diagnosis Right ankle fracture.   Onset Date/Surgical Date 11/13/14   Next MD Visit 03/30/2015                     [facility] Adult PT Treatment/Exercise - 02/05/15 0001    Ambulation/Gait   Pre-Gait Activities tandem stance weight shift heel/toe   Gait Comments limited in stance phase by decreased DF   Knee/Hip Exercises: Aerobic   Elliptical L1 incline 1 x 3 min , incline 9 x 2 min   Nustep 57mn with focus on rom/strength   Ankle Exercises: Stretches   Plantar Fascia Stretch 2 reps;30 seconds  seated   Soleus Stretch 2 reps;30 seconds   Gastroc Stretch 2 reps;30 seconds   Ankle Exercises:  Standing   Heel Raises 20 reps  10 toes straight, 10 toes out                     PT Long Term Goals - 02/05/15 1115    PT LONG TERM GOAL #1   Title Ind with advanced HEP.   Time 12   Period Weeks   Status Achieved   PT LONG TERM GOAL #2   Title Increase right ankle dorsiflexion to 6- 8 degrees to normalize the patient's gait pattern  -6 active; 6 deg passive   Time 12   Period Weeks   Status Partially Met   PT LONG TERM GOAL #3   Title Increase right ankle strength to 4+ to 5/5 to increase stability for functional tasks  met, except DF   Time 12   Period Weeks   Status On-going   PT LONG TERM GOAL #4   Title Walk a community distance with no gait deviation and pain not > 3/10.   Time 12   Period Weeks   Status On-going   PT LONG TERM GOAL #5   Title Perform ADL's with pain not > 3/10.   Time 12  Period Weeks   Status On-going               Plan - 02/05/15 1123    Clinical Impression Statement Patient is progressing with goals. She still is limited with DF but this should progress quicker now with standing runners stretch for gastroc/soleus. Additionally, PT advised her to wear her shoe as much as possible to normalize gait. Her strength goals are met execpt for plantar fleixon.    PT Next Visit Plan continue with elliptical, weightbearing stretches, gait and balance   Consulted and Agree with Plan of Care Patient        Problem List Patient Active Problem List   Diagnosis Date Noted  . Depression 01/12/2015  . GAD (generalized anxiety disorder) 01/12/2015  . Current smoker 12/17/2014  . Hyperlipemia 07/03/2014  . Vitamin D deficiency 07/03/2014  . Menopause 07/02/2014  . 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 GERD 06/25/2010    Madelyn Flavors PT  02/05/2015, 11:39 AM  [facility] [address] [place], [place], [address] Phone: [phone]   Fax:   [phone]

## 2015-02-05 NOTE — Patient Instructions (Addendum)
Achilles Tendon Stretch   Stand with hands supported on wall, elbows slightly bent, feet parallel and both heels on floor, front knee bent, back knee straight. Hold 30 seconds. Do 3 times. Slowly relax back knee, still keeping heel on ground, but knee slightly bent until a stretch is felt in achilles tendon. Hold _30___ seconds. Do 3 times. 2x/day. Copyright  VHI. All rights reserved.   Ankle Plantar Flexion / Dorsiflexion, Sitting   Sit and gently grasp one foot. Bend foot and ankle down. Hold  position 30___ seconds. Repeat 3___ times per session. Do _2__ sessions per day.  Copyright  VHI. All rights reserved.   Gail Daniels, PT 02/05/2015 11:19 AM. [facility] [address] [place], [place], [address] Phone: [phone]   Fax:  [phone]

## 2015-02-09 ENCOUNTER — Ambulatory Visit: Payer: Self-pay | Admitting: Physical Therapy

## 2015-02-09 ENCOUNTER — Encounter: Payer: Self-pay | Admitting: Physical Therapy

## 2015-02-09 DIAGNOSIS — M25671 Stiffness of right ankle, not elsewhere classified: Secondary | ICD-10-CM

## 2015-02-09 DIAGNOSIS — S82891A Other fracture of right lower leg, initial encounter for closed fracture: Secondary | ICD-10-CM

## 2015-02-09 DIAGNOSIS — M25571 Pain in right ankle and joints of right foot: Secondary | ICD-10-CM

## 2015-02-09 NOTE — Therapy (Signed)
[facility] [place], [place], [address] Phone: [phone]   Fax:  [phone]  Physical Therapy Treatment  Patient Details  Name: Gail Daniels MRN: [id number] Date of Birth: 1966-01-27 Referring Provider:  Sharion Balloon, FNP  Encounter Date: 02/09/2015      PT End of Session - 02/09/15 1010    Visit Number 10   Number of Visits 24   Date for PT Re-Evaluation 03/23/15   PT Start Time 0944   PT Stop Time 1043   PT Time Calculation (min) 59 min   Activity Tolerance Patient tolerated treatment well   Behavior During Therapy [facility] for tasks assessed/performed      Past Medical History  Diagnosis Date  . Adult ADHD   . Hypertension   . GERD (gastroesophageal reflux disease)     Past Surgical History  Procedure Laterality Date  . Cesarean section    . Tubal ligation    . Ankle surgery Right     There were no vitals filed for this visit.  Visit Diagnosis:  Stiffness of ankle joint, right  Ankle fracture, right  Right ankle pain      Subjective Assessment - 02/09/15 0955    Subjective Patient arrived in CAM boot today and reported swelling in ankle   Currently in Pain? Yes   Pain Score 3    Pain Location Ankle   Pain Orientation Right;Medial   Pain Descriptors / Indicators Sore   Pain Onset More than a month ago   Aggravating Factors  movement into inversion   Pain Relieving Factors rest                         [facility] Adult PT Treatment/Exercise - 02/09/15 0001    Knee/Hip Exercises: Aerobic   Nustep 54mn with focus on rom/strength   Knee/Hip Exercises: Standing   Lateral Step Up Right;3 sets;10 reps;Step Height: 6"   Forward Step Up Right;3 sets;10 reps;Step Height: 6"   Knee/Hip Exercises: Supine   Other Supine Knee/Hip Exercises 4way ankle yellow bands x10 each (x5 inver due to pain)   Vasopneumatic   Number Minutes Vasopneumatic  15 minutes   Vasopnuematic Location  Ankle   Vasopneumatic Pressure Medium   Manual Therapy   Manual Therapy Passive ROM   Passive ROM gentle low load holds for all ankle motions   Ankle Exercises: Standing   Rocker Board 3 minutes   Ankle Exercises: Seated   Other Seated Ankle Exercises prostretch x348m for DF                     PT Long Term Goals - 02/09/15 1013    PT LONG TERM GOAL #1   Title Ind with advanced HEP.   Time 12   Period Weeks   Status Achieved   PT LONG TERM GOAL #2   Title Increase right ankle dorsiflexion to 6- 8 degrees to normalize the patient's gait pattern   Time 12   Period Weeks   Status Partially Met  5 degrees with knee bent   PT LONG TERM GOAL #3   Title Increase right ankle strength to 4+ to 5/5 to increase stability for functional tasks   Time 12   Period Weeks   Status On-going  NT   PT LONG TERM GOAL #4   Title Walk a community distance with no gait deviation and pain not > 3/10.   Time 12  Period Weeks   Status On-going  ongoing deviation due to weakness   PT LONG TERM GOAL #5   Title Perform ADL's with pain not > 3/10.   Time 12   Period Weeks   Status On-going  unable to perform ADL's               Plan - 02/09/15 1026    Clinical Impression Statement Patient is progressing slowly with activities due to swelling in right ankle and some pain medial ankle today. Patient is able to continue with weight bearing activities with no difficulty. Patient was unable to wear shoes due to swelling per patient and arrived in CAM boot. Goals ongoing due to pain, ROM, and strength deficits.   Pt will benefit from skilled therapeutic intervention in order to improve on the following deficits Abnormal gait;Decreased activity tolerance;Decreased strength;Difficulty walking;Increased edema;Pain;Decreased range of motion   Rehab Potential Good   PT Frequency 2x / week   PT Duration 12 weeks   PT Treatment/Interventions ADLs/Self Care Home Management;Cryotherapy;Printmaker;Therapeutic activities;Therapeutic exercise;Neuromuscular re-education;Patient/family education;Manual techniques;Passive range of motion;Vasopneumatic Device   PT Next Visit Plan cont with POC per patient tolerance   Consulted and Agree with Plan of Care Patient        Problem List Patient Active Problem List   Diagnosis Date Noted  . Depression 01/12/2015  . GAD (generalized anxiety disorder) 01/12/2015  . Current smoker 12/17/2014  . Hyperlipemia 07/03/2014  . Vitamin D deficiency 07/03/2014  . Menopause 07/02/2014  . 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 GERD 06/25/2010   Ladean Raya, PTA 02/09/2015 12:42 PM  Mali Applegate MPT  [place], [place], PTA 02/09/2015, 12:42 PM  [facility] [address] [place], [place], [address] Phone: [phone]   Fax:  [phone]

## 2015-02-10 ENCOUNTER — Telehealth: Payer: Self-pay | Admitting: Family

## 2015-02-10 ENCOUNTER — Ambulatory Visit: Payer: 59 | Admitting: Physical Therapy

## 2015-02-10 DIAGNOSIS — S82891A Other fracture of right lower leg, initial encounter for closed fracture: Secondary | ICD-10-CM

## 2015-02-10 DIAGNOSIS — M25571 Pain in right ankle and joints of right foot: Secondary | ICD-10-CM

## 2015-02-10 DIAGNOSIS — M25671 Stiffness of right ankle, not elsewhere classified: Secondary | ICD-10-CM

## 2015-02-10 DIAGNOSIS — F988 Other specified behavioral and emotional disorders with onset usually occurring in childhood and adolescence: Secondary | ICD-10-CM

## 2015-02-10 MED ORDER — AMPHETAMINE-DEXTROAMPHETAMINE 20 MG PO TABS: 20.0000 mg | ORAL_TABLET | Freq: Three times a day (TID) | ORAL | Status: DC

## 2015-02-10 NOTE — Telephone Encounter (Signed)
Pt aware written Rx is at front desk ready for pickup  

## 2015-02-10 NOTE — Therapy (Signed)
[facility] [place], [place], [address] Phone: [phone]   Fax:  [phone]  Physical Therapy Treatment  Patient Details  Name: Gail Daniels MRN: [id number] Date of Birth: [birth year] Referring Provider:  Sharion Balloon, FNP  Encounter Date: 02/10/2015      PT End of Session - 02/10/15 1404    Visit Number 11   Number of Visits 24   Date for PT Re-Evaluation 03/23/15   PT Start Time 0100   PT Stop Time 0148   PT Time Calculation (min) 48 min   Activity Tolerance Patient tolerated treatment well   Behavior During Therapy [facility] for tasks assessed/performed      Past Medical History  Diagnosis Date  . Adult ADHD   . Hypertension   . GERD (gastroesophageal reflux disease)     Past Surgical History  Procedure Laterality Date  . Cesarean section    . Tubal ligation    . Ankle surgery Right     There were no vitals filed for this visit.  Visit Diagnosis:  Stiffness of ankle joint, right  Ankle fracture, right  Right ankle pain      Subjective Assessment - 02/09/15 0955    Subjective Patient arrived in CAM boot today and reported swelling in ankle   Currently in Pain? Yes   Pain Score 3    Pain Location Ankle   Pain Orientation Right;Medial   Pain Descriptors / Indicators Sore   Pain Onset More than a month ago   Aggravating Factors  movement into inversion   Pain Relieving Factors rest                                      PT Long Term Goals - 02/09/15 1013    PT LONG TERM GOAL #1   Title Ind with advanced HEP.   Time 12   Period Weeks   Status Achieved   PT LONG TERM GOAL #2   Title Increase right ankle dorsiflexion to 6- 8 degrees to normalize the patient's gait pattern   Time 12   Period Weeks   Status Partially Met  5 degrees with knee bent   PT LONG TERM GOAL #3   Title Increase right ankle strength to 4+ to 5/5 to increase stability for functional tasks   Time 12   Period Weeks   Status On-going  NT   PT LONG TERM GOAL #4   Title Walk a community distance with no gait deviation and pain not > 3/10.   Time 12   Period Weeks   Status On-going  ongoing deviation due to weakness   PT LONG TERM GOAL #5   Title Perform ADL's with pain not > 3/10.   Time 12   Period Weeks   Status On-going  unable to perform ADL's               Plan - 02/09/15 1026    Clinical Impression Statement Patient is progressing slowly with activities due to swelling in right ankle and some pain medial ankle today. Patient is able to continue with weight bearing activities with no difficulty. Patient was unable to wear shoes due to swelling per patient and arrived in CAM boot. Goals ongoing due to pain, ROM, and strength deficits.   Pt will benefit from skilled therapeutic intervention in order to improve on the following deficits Abnormal gait;Decreased  activity tolerance;Decreased strength;Difficulty walking;Increased edema;Pain;Decreased range of motion   Rehab Potential Good   PT Frequency 2x / week   PT Duration 12 weeks   PT Treatment/Interventions ADLs/Self Care Home Management;Cryotherapy;English as a second language teacher;Therapeutic activities;Therapeutic exercise;Neuromuscular re-education;Patient/family education;Manual techniques;Passive range of motion;Vasopneumatic Device   PT Next Visit Plan cont with POC per patient tolerance   Consulted and Agree with Plan of Care Patient        Problem List Patient Active Problem List   Diagnosis Date Noted  . Depression 01/12/2015  . GAD (generalized anxiety disorder) 01/12/2015  . Current smoker 12/17/2014  . Hyperlipemia 07/03/2014  . Vitamin D deficiency 07/03/2014  . Menopause 07/02/2014  . 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 GERD 06/25/2010   Treatment:  Nustep x 18 minutes at level 4 f/b elliptical at incline set on 10  and resistance on 5 x 10 minutes f/b 1-1 PROM to patient's right ankle in all directions x 10 minutes.   Karolee Meloni, [place] MPT 02/10/2015, 2:08 PM  [facility] [address] [place], [place], [address] Phone: [phone]   Fax:  [phone]

## 2015-02-10 NOTE — Telephone Encounter (Signed)
RX ready for pick up 

## 2015-02-11 ENCOUNTER — Encounter: Payer: 59 | Admitting: Physical Therapy

## 2015-02-18 ENCOUNTER — Encounter: Payer: 59 | Admitting: Physical Therapy

## 2015-02-19 ENCOUNTER — Encounter: Payer: Self-pay | Admitting: Physical Therapy

## 2015-02-19 ENCOUNTER — Ambulatory Visit: Payer: Self-pay | Attending: Orthopedic Surgery | Admitting: Physical Therapy

## 2015-02-19 DIAGNOSIS — M25571 Pain in right ankle and joints of right foot: Secondary | ICD-10-CM | POA: Insufficient documentation

## 2015-02-19 DIAGNOSIS — M25671 Stiffness of right ankle, not elsewhere classified: Secondary | ICD-10-CM | POA: Insufficient documentation

## 2015-02-19 DIAGNOSIS — S82891A Other fracture of right lower leg, initial encounter for closed fracture: Secondary | ICD-10-CM | POA: Insufficient documentation

## 2015-02-19 NOTE — Therapy (Signed)
[facility] [place], [place], [address] Phone: [phone]   Fax:  [phone]  Physical Therapy Treatment  Patient Details  Name: Gail Daniels MRN: [id number] Date of Birth: September 30, 1965 Referring Provider:  Sharion Balloon, FNP  Encounter Date: 02/19/2015      PT End of Session - 02/19/15 1020    Visit Number 12   Number of Visits 24   Date for PT Re-Evaluation 03/23/15   PT Start Time 0944   PT Stop Time 1040   PT Time Calculation (min) 56 min   Activity Tolerance Patient tolerated treatment well   Behavior During Therapy [facility] for tasks assessed/performed      Past Medical History  Diagnosis Date  . Adult ADHD   . Hypertension   . GERD (gastroesophageal reflux disease)     Past Surgical History  Procedure Laterality Date  . Cesarean section    . Tubal ligation    . Ankle surgery Right     There were no vitals filed for this visit.  Visit Diagnosis:  Stiffness of ankle joint, right  Ankle fracture, right  Right ankle pain      Subjective Assessment - 02/19/15 0951    Subjective Patient arrived in CAM boot today and reported swelling in ankle   How long can you walk comfortably? Cannot walk on right foot yet.  NWBing with rolling walker.   Currently in Pain? Yes   Pain Score 3    Pain Location Ankle   Pain Orientation Right;Medial   Pain Descriptors / Indicators Sore   Pain Type Acute pain   Pain Onset More than a month ago   Aggravating Factors  movement into inversion   Pain Relieving Factors rest            [facility] PT Assessment - 02/19/15 0001    AROM   Overall AROM  Within functional limits for tasks performed   Overall AROM Comments AROM 8 degrees DF                     [facility] Adult PT Treatment/Exercise - 02/19/15 0001    Knee/Hip Exercises: Aerobic   Nustep 36mn L5 with focus on rom/strength   Knee/Hip Exercises: Standing   Lateral Step Up Right;3 sets;10 reps;Step  Height: 6"   Forward Step Up Right;3 sets;10 reps;Step Height: 6"   Vasopneumatic   Number Minutes Vasopneumatic  15 minutes   Vasopnuematic Location  Ankle   Vasopneumatic Pressure Medium   Ankle Exercises: Standing   Rocker Board 3 minutes   Ankle Exercises: Seated   Other Seated Ankle Exercises Ankle isolator 1# 3x10 , circles 1# 2x10, 1/2# for iver x10 , ever x10   Other Seated Ankle Exercises prostretch x373m for DF                     PT Long Term Goals - 02/19/15 1021    PT LONG TERM GOAL #1   Title Ind with advanced HEP.   Time 12   Period Weeks   Status Achieved   PT LONG TERM GOAL #2   Title Increase right ankle dorsiflexion to 6- 8 degrees to normalize the patient's gait pattern   Time 12   Period Weeks   Status Achieved  AROM 8 degrees DF   PT LONG TERM GOAL #3   Title Increase right ankle strength to 4+ to 5/5 to increase stability for functional tasks   Time  12   Period Weeks   Status On-going   PT LONG TERM GOAL #4   Title Walk a community distance with no gait deviation and pain not > 3/10.   Time 12   Period Weeks   Status On-going   PT LONG TERM GOAL #5   Title Perform ADL's with pain not > 3/10.   Time 12   Period Weeks   Status On-going               Plan - 02/19/15 1029    Clinical Impression Statement Patient progressing with all activities. patient has some reported swelling and unable to wear shoes and arrived with CAM boot. patient has continued pain with inversion movement. Patient has improved with AROM for right DF and met LTG #2 other goals ongoing due to pain and strength deficits   Pt will benefit from skilled therapeutic intervention in order to improve on the following deficits Abnormal gait;Decreased activity tolerance;Decreased strength;Difficulty walking;Increased edema;Pain;Decreased range of motion   Rehab Potential Good   PT Frequency 2x / week   PT Duration 12 weeks   PT Treatment/Interventions ADLs/Self  Care Home Management;Cryotherapy;English as a second language teacher;Therapeutic activities;Therapeutic exercise;Neuromuscular re-education;Patient/family education;Manual techniques;Passive range of motion;Vasopneumatic Device   PT Next Visit Plan cont with POC per patient tolerance   Consulted and Agree with Plan of Care Patient        Problem List Patient Active Problem List   Diagnosis Date Noted  . Depression 01/12/2015  . GAD (generalized anxiety disorder) 01/12/2015  . Current smoker 12/17/2014  . Hyperlipemia 07/03/2014  . Vitamin D deficiency 07/03/2014  . Menopause 07/02/2014  . 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 GERD 06/25/2010    Phillips Climes, PTA 02/19/2015, 10:41 AM  [facility] [address] [place], [place], [address] Phone: [phone]   Fax:  [phone]

## 2015-02-23 ENCOUNTER — Ambulatory Visit: Payer: Self-pay | Admitting: Physical Therapy

## 2015-02-23 ENCOUNTER — Encounter: Payer: Self-pay | Admitting: Family

## 2015-02-23 ENCOUNTER — Encounter: Payer: Self-pay | Admitting: Physical Therapy

## 2015-02-23 ENCOUNTER — Ambulatory Visit (INDEPENDENT_AMBULATORY_CARE_PROVIDER_SITE_OTHER): Payer: 59 | Admitting: Family

## 2015-02-23 VITALS — BP 124/79 | HR 77 | Temp 97.1°F | Ht 65.0 in | Wt 134.2 lb

## 2015-02-23 DIAGNOSIS — K088 Other specified disorders of teeth and supporting structures: Secondary | ICD-10-CM

## 2015-02-23 DIAGNOSIS — M25571 Pain in right ankle and joints of right foot: Secondary | ICD-10-CM

## 2015-02-23 DIAGNOSIS — F411 Generalized anxiety disorder: Secondary | ICD-10-CM | POA: Diagnosis not present

## 2015-02-23 DIAGNOSIS — Z23 Encounter for immunization: Secondary | ICD-10-CM

## 2015-02-23 DIAGNOSIS — F32A Depression, unspecified: Secondary | ICD-10-CM

## 2015-02-23 DIAGNOSIS — K0889 Other specified disorders of teeth and supporting structures: Secondary | ICD-10-CM

## 2015-02-23 DIAGNOSIS — M25671 Stiffness of right ankle, not elsewhere classified: Secondary | ICD-10-CM

## 2015-02-23 DIAGNOSIS — Z720 Tobacco use: Secondary | ICD-10-CM | POA: Diagnosis not present

## 2015-02-23 DIAGNOSIS — K219 Gastro-esophageal reflux disease without esophagitis: Secondary | ICD-10-CM | POA: Diagnosis not present

## 2015-02-23 DIAGNOSIS — S82891A Other fracture of right lower leg, initial encounter for closed fracture: Secondary | ICD-10-CM

## 2015-02-23 DIAGNOSIS — F17200 Nicotine dependence, unspecified, uncomplicated: Secondary | ICD-10-CM

## 2015-02-23 DIAGNOSIS — F329 Major depressive disorder, single episode, unspecified: Secondary | ICD-10-CM | POA: Diagnosis not present

## 2015-02-23 MED ORDER — PANTOPRAZOLE SODIUM 20 MG PO TBEC: 20.0000 mg | DELAYED_RELEASE_TABLET | Freq: Two times a day (BID) | ORAL | Status: DC

## 2015-02-23 MED ORDER — HYDROCODONE-ACETAMINOPHEN 5-325 MG PO TABS: 1.0000 | ORAL_TABLET | Freq: Four times a day (QID) | ORAL | Status: DC | PRN

## 2015-02-23 NOTE — Addendum Note (Signed)
Addended by: Lorelee Cover C on: 02/23/2015 12:56 PM   Modules accepted: Orders

## 2015-02-23 NOTE — Patient Instructions (Signed)
Food Choices for Gastroesophageal Reflux Disease When you have gastroesophageal reflux disease (GERD), the foods you eat and your eating habits are very important. Choosing the right foods can help ease the discomfort of GERD. WHAT GENERAL GUIDELINES DO I NEED TO FOLLOW?  Choose fruits, vegetables, whole grains, low-fat dairy products, and low-fat meat, fish, and poultry.  Limit fats such as oils, salad dressings, butter, nuts, and avocado.  Keep a food diary to identify foods that cause symptoms.  Avoid foods that cause reflux. These may be different for different people.  Eat frequent small meals instead of three large meals each day.  Eat your meals slowly, in a relaxed setting.  Limit fried foods.  Cook foods using methods other than frying.  Avoid drinking alcohol.  Avoid drinking large amounts of liquids with your meals.  Avoid bending over or lying down until 2-3 hours after eating. WHAT FOODS ARE NOT RECOMMENDED? The following are some foods and drinks that may worsen your symptoms: Vegetables Tomatoes. Tomato juice. Tomato and spaghetti sauce. Chili peppers. Onion and garlic. Horseradish. Fruits Oranges, grapefruit, and lemon (fruit and juice). Meats High-fat meats, fish, and poultry. This includes hot dogs, ribs, ham, sausage, salami, and bacon. Dairy Whole milk and chocolate milk. Sour cream. Cream. Butter. Ice cream. Cream cheese.  Beverages Coffee and tea, with or without caffeine. Carbonated beverages or energy drinks. Condiments Hot sauce. Barbecue sauce.  Sweets/Desserts Chocolate and cocoa. Donuts. Peppermint and spearmint. Fats and Oils High-fat foods, including [place] fries and potato chips. Other Vinegar. Strong spices, such as black pepper, white pepper, red pepper, cayenne, curry powder, cloves, ginger, and chili powder. The items listed above may not be a complete list of foods and beverages to avoid. Contact your dietitian for more  information. Document Released: 05/30/2005 Document Revised: 06/04/2013 Document Reviewed: 04/03/2013 [facility] Patient Information 2015 [place], [place]. This information is not intended to replace advice given to you by your health care provider. Make sure you discuss any questions you have with your health care provider. Dental Pain A tooth ache may be caused by cavities (tooth decay). Cavities expose the nerve of the tooth to air and hot or cold temperatures. It may come from an infection or abscess (also called a boil or furuncle) around your tooth. It is also often caused by dental caries (tooth decay). This causes the pain you are having. DIAGNOSIS  Your caregiver can diagnose this problem by exam. TREATMENT   If caused by an infection, it may be treated with medications which kill germs (antibiotics) and pain medications as prescribed by your caregiver. Take medications as directed.  Only take over-the-counter or prescription medicines for pain, discomfort, or fever as directed by your caregiver.  Whether the tooth ache today is caused by infection or dental disease, you should see your dentist as soon as possible for further care. SEEK MEDICAL CARE IF: The exam and treatment you received today has been provided on an emergency basis only. This is not a substitute for complete medical or dental care. If your problem worsens or new problems (symptoms) appear, and you are unable to meet with your dentist, call or return to this location. SEEK IMMEDIATE MEDICAL CARE IF:   You have a fever.  You develop redness and swelling of your face, jaw, or neck.  You are unable to open your mouth.  You have severe pain uncontrolled by pain medicine. MAKE SURE YOU:   Understand these instructions.  Will watch your condition.  Will get  help right away if you are not doing well or get worse. Document Released: 05/30/2005 Document Revised: 08/22/2011 Document Reviewed: 01/16/2008 [facility]  Patient Information 2015 [place], [place]. This information is not intended to replace advice given to you by your health care provider. Make sure you discuss any questions you have with your health care provider.

## 2015-02-23 NOTE — Therapy (Signed)
[facility] [place], [place], [address] Phone: [phone]   Fax:  [phone]  Physical Therapy Treatment  Patient Details  Name: Gail Daniels MRN: [id number] Date of Birth: April 19, 1966 Referring Provider:  Sharion Balloon, FNP  Encounter Date: 02/23/2015      PT End of Session - 02/23/15 1010    Visit Number 13   Number of Visits 24   Date for PT Re-Evaluation 03/23/15   PT Start Time 0943   PT Stop Time 1039   PT Time Calculation (min) 56 min   Activity Tolerance Patient tolerated treatment well   Behavior During Therapy [facility] for tasks assessed/performed      Past Medical History  Diagnosis Date  . Adult ADHD   . Hypertension   . GERD (gastroesophageal reflux disease)     Past Surgical History  Procedure Laterality Date  . Cesarean section    . Tubal ligation    . Ankle surgery Right     There were no vitals filed for this visit.  Visit Diagnosis:  Stiffness of ankle joint, right  Ankle fracture, right  Right ankle pain      Subjective Assessment - 02/23/15 0951    Subjective Patient arrived with reguar shoes today and reported no pain.   Currently in Pain? No/denies                         [facility] Adult PT Treatment/Exercise - 02/23/15 0001    Knee/Hip Exercises: Aerobic   Tread Mill x31mn @ 1.0   Nustep 123m L5 with focus on rom/strength   Knee/Hip Exercises: Standing   Lateral Step Up Right;3 sets;10 reps;Step Height: 6"   Forward Step Up Right;3 sets;10 reps;Step Height: 6"   Vasopneumatic   Number Minutes Vasopneumatic  15 minutes   Vasopnuematic Location  Ankle   Vasopneumatic Pressure Medium   Ankle Exercises: Standing   Rocker Board 3 minutes   Ankle Exercises: Seated   Other Seated Ankle Exercises Ankle isolator 1# 3x10 , circles 1# 2x10   Other Seated Ankle Exercises prostretch x3m26mfor DF                     PT Long Term Goals - 02/23/15 1011     PT LONG TERM GOAL #1   Title Ind with advanced HEP.   Time 12   Period Weeks   Status Achieved   PT LONG TERM GOAL #2   Title Increase right ankle dorsiflexion to 6- 8 degrees to normalize the patient's gait pattern   Time 12   Period Weeks   Status Achieved   PT LONG TERM GOAL #3   Title Increase right ankle strength to 4+ to 5/5 to increase stability for functional tasks   Time 12   Period Weeks   Status On-going   PT LONG TERM GOAL #4   Title Walk a community distance with no gait deviation and pain not > 3/10.   Time 12   Period Weeks   Status On-going   PT LONG TERM GOAL #5   Title Perform ADL's with pain not > 3/10.   Time 12   Period Weeks   Status On-going               Plan - 02/23/15 1013    Clinical Impression Statement Patient progressing with all activities. Patient has little to no pain unless movement into inversion. patient able  to perform tredmill today with good heel toe pattern and no gait deviation. Patient arrived in shoes and reported less edema in ankle. No further goals met today due to pain and strength deficits.   Pt will benefit from skilled therapeutic intervention in order to improve on the following deficits Abnormal gait;Decreased activity tolerance;Decreased strength;Difficulty walking;Increased edema;Pain;Decreased range of motion   Rehab Potential Good   PT Frequency 2x / week   PT Duration 12 weeks   PT Treatment/Interventions ADLs/Self Care Home Management;Cryotherapy;English as a second language teacher;Therapeutic activities;Therapeutic exercise;Neuromuscular re-education;Patient/family education;Manual techniques;Passive range of motion;Vasopneumatic Device   PT Next Visit Plan cont with POC per patient tolerance   Consulted and Agree with Plan of Care Patient        Problem List Patient Active Problem List   Diagnosis Date Noted  . Depression 01/12/2015  . GAD (generalized anxiety  disorder) 01/12/2015  . Current smoker 12/17/2014  . Hyperlipemia 07/03/2014  . Vitamin D deficiency 07/03/2014  . Menopause 07/02/2014  . 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 GERD 06/25/2010    Phillips Climes, PTA 02/23/2015, 10:42 AM  [facility] [address] [place], [place], [address] Phone: [phone]   Fax:  [phone]

## 2015-02-23 NOTE — Progress Notes (Signed)
Subjective:    Patient ID: Gail Daniels, female    DOB: [birth year], 49 y.o.   MRN: [id number]  Pt presents to the office today for follow up on GAD, Depression, and for tooth pain. Pt was seen in the office on 01/12/15 and we increased her Lexapro from 10 to 20 mg. Pt reports feeling much better. Pt's she is having intermittent tooth pain in her left upper tooth pain and right upper back tooth pain. Pt states she has went to a dentists, but was told she could not have them pulled until 6 months which is January 2017. Pt is requesting pain medication to help her pain until she can get her tooth pain resolved. Pt states in January she is going to have them all "removed and get dentures".  Dental Pain  This is a new problem. The current episode started more than 1 month ago. The problem has been waxing and waning. The pain is at a severity of 10/10. The pain is moderate. Associated symptoms include facial pain. Pertinent negatives include no fever. She has tried acetaminophen and NSAIDs for the symptoms. The treatment provided mild relief.  Gastrophageal Reflux She complains of belching and heartburn. She reports no coughing or no nausea. This is a chronic problem. The current episode started more than 1 year ago. The problem occurs frequently. The problem has been waxing and waning. The symptoms are aggravated by certain foods and lying down. Pertinent negatives include no fatigue. She has tried a PPI for the symptoms.  Anxiety Symptoms include excessive worry and nervous/anxious behavior. Patient reports no decreased concentration, depressed mood, insomnia, muscle tension, nausea, palpitations or shortness of breath. Symptoms occur occasionally. The quality of sleep is good.   Her past medical history is significant for anxiety/panic attacks and depression. There is no history of CAD. Past treatments include benzodiazephines and SSRIs. Compliance with prior treatments has been good.  Depression  The patient presents with depression.  This is a chronic problem.  The current episode started more than 1 year ago.   The problem occurs intermittently.  The problem has been waxing and waning since onset.  Associated symptoms include no decreased concentration, no fatigue, no helplessness, no hopelessness, does not have insomnia, not irritable, no decreased interest, no headaches and not sad.  Past treatments include SSRIs - Selective serotonin reuptake inhibitors.  Compliance with treatment is good.  Past medical history includes anxiety and depression.       Review of Systems  Constitutional: Negative.  Negative for fever and fatigue.  HENT: Negative.   Eyes: Negative.   Respiratory: Negative.  Negative for cough and shortness of breath.   Cardiovascular: Negative.  Negative for palpitations.  Gastrointestinal: Positive for heartburn. Negative for nausea.  Endocrine: Negative.   Genitourinary: Negative.   Musculoskeletal: Negative.   Neurological: Negative.  Negative for headaches.  Hematological: Negative.   Psychiatric/Behavioral: Positive for depression. Negative for decreased concentration. The patient is nervous/anxious. The patient does not have insomnia.   All other systems reviewed and are negative.      Objective:   Physical Exam  Constitutional: She is oriented to person, place, and time. She appears well-developed and well-nourished. She is not irritable. No distress.  HENT:  Head: Normocephalic and atraumatic.  Right Ear: External ear normal.  Left Ear: External ear normal.  Nose: Nose normal.  Mouth/Throat: Oropharynx is clear and moist. Dental caries present.  Eyes: Pupils are equal, round, and reactive to light.  Neck: Normal range  of motion. Neck supple. No thyromegaly present.  Cardiovascular: Normal rate, regular rhythm, normal heart sounds and intact distal pulses.   No murmur heard. Pulmonary/Chest: Effort normal and breath sounds normal. No respiratory  distress. She has no wheezes.  Abdominal: Soft. Bowel sounds are normal. She exhibits no distension. There is no tenderness.  Musculoskeletal: Normal range of motion. She exhibits no edema or tenderness.  Neurological: She is alert and oriented to person, place, and time. She has normal reflexes. No cranial nerve deficit.  Skin: Skin is warm and dry.  Psychiatric: She has a normal mood and affect. Her behavior is normal. Judgment and thought content normal.  Vitals reviewed.     BP 124/79 mmHg  Pulse 77  Temp(Src) 97.1 F (36.2 C) (Oral)  Ht 5' 5"  (1.651 m)  Wt 134 lb 3.2 oz (60.873 kg)  BMI 22.33 kg/m2     Assessment & Plan:  1. GAD (generalized anxiety disorder) - CMP14+EGFR  2. Depression - CMP14+EGFR  3. Current smoker - CMP14+EGFR  4. Gastroesophageal reflux disease, esophagitis presence not specified -Protonix changed to 20 mg BID from 40 mg PO Daily - CMP14+EGFR - pantoprazole (PROTONIX) 20 MG tablet; Take 1 tablet (20 mg total) by mouth 2 (two) times daily.  Dispense: 180 tablet; Refill: 1  5. Pain, dental -Pt told to follow up with dentists -Pt given RX for pain medication Pt told to only take when she needed it and could write this rx continuously for her  - CMP14+EGFR - HYDROcodone-acetaminophen (NORCO/VICODIN) 5-325 MG per tablet; Take 1 tablet by mouth every 6 (six) hours as needed for moderate pain.  Dispense: 30 tablet; Refill: 0   Continue all meds Labs pending Health Maintenance reviewed- TDAP given today- Pt to schedule mammogram and pap  Diet and exercise encouraged RTO 3 months   Evelina Dun, FNP

## 2015-02-24 LAB — CMP14+EGFR
A/G RATIO: 2.2 (ref 1.1–2.5)
ALT: 12 IU/L (ref 0–32)
AST: 12 IU/L (ref 0–40)
Albumin: 4.3 g/dL (ref 3.5–5.5)
Alkaline Phosphatase: 78 IU/L (ref 39–117)
BUN / CREAT RATIO: 20 (ref 9–23)
BUN: 15 mg/dL (ref 6–24)
Bilirubin Total: <0.2 mg/dL (ref 0.0–1.2)
CO2: 27 mmol/L (ref 18–29)
Calcium: 8.7 mg/dL (ref 8.7–10.2)
Chloride: 102 mmol/L (ref 97–108)
Creatinine, Ser: 0.75 mg/dL (ref 0.57–1.00)
GFR, EST AFRICAN AMERICAN: 108 mL/min/{1.73_m2} (ref >59)
GFR, EST NON AFRICAN AMERICAN: 94 mL/min/{1.73_m2} (ref >59)
GLOBULIN, TOTAL: 2 g/dL (ref 1.5–4.5)
Glucose: 88 mg/dL (ref 65–99)
POTASSIUM: 4 mmol/L (ref 3.5–5.2)
SODIUM: 143 mmol/L (ref 134–144)
TOTAL PROTEIN: 6.3 g/dL (ref 6.0–8.5)

## 2015-02-25 ENCOUNTER — Encounter: Payer: 59 | Admitting: Physical Therapy

## 2015-02-26 ENCOUNTER — Ambulatory Visit: Payer: Self-pay | Admitting: Physical Therapy

## 2015-03-02 ENCOUNTER — Ambulatory Visit: Payer: Self-pay | Admitting: Physical Therapy

## 2015-03-02 ENCOUNTER — Encounter: Payer: Self-pay | Admitting: Physical Therapy

## 2015-03-02 DIAGNOSIS — S82891A Other fracture of right lower leg, initial encounter for closed fracture: Secondary | ICD-10-CM

## 2015-03-02 DIAGNOSIS — M25671 Stiffness of right ankle, not elsewhere classified: Secondary | ICD-10-CM

## 2015-03-02 DIAGNOSIS — M25571 Pain in right ankle and joints of right foot: Secondary | ICD-10-CM

## 2015-03-02 NOTE — Therapy (Signed)
[facility] [address] [place], [place], [address] Phone: [phone]   Fax:  [phone]  Physical Therapy Treatment  Patient Details  Name: Gail Daniels MRN: [id number] Date of Birth: Jan 12, 1966 Referring Provider:  Junie Spencer, FNP  Encounter Date: 03/02/2015      PT End of Session - 03/02/15 1012    Visit Number 14   Number of Visits 24   Date for PT Re-Evaluation 03/23/15   PT Start Time 0946   PT Stop Time 1028   PT Time Calculation (min) 42 min   Activity Tolerance Patient tolerated treatment well   Behavior During Therapy [facility] for tasks assessed/performed      Past Medical History  Diagnosis Date  . Adult ADHD   . Hypertension   . GERD (gastroesophageal reflux disease)     Past Surgical History  Procedure Laterality Date  . Cesarean section    . Tubal ligation    . Ankle surgery Right     There were no vitals filed for this visit.  Visit Diagnosis:  Stiffness of ankle joint, right  Ankle fracture, right  Right ankle pain      Subjective Assessment - 03/02/15 0953    Subjective Patient arrived with reguar shoes today and reported no pain. Has not heard back from MD yet regarding pain with inversion movement medial right ankle   Currently in Pain? No/denies                         [facility] Adult PT Treatment/Exercise - 03/02/15 0001    Knee/Hip Exercises: Aerobic   Tread Mill x40min @ 1.2   Nustep L5 with focus on rom/strength   Knee/Hip Exercises: Standing   Lateral Step Up Right;3 sets;10 reps;Step Height: 6"   Forward Step Up Right;3 sets;10 reps;Step Height: 6"   Vasopneumatic   Number Minutes Vasopneumatic  --   Vasopnuematic Location  --   Vasopneumatic Pressure --   Ankle Exercises: Standing   Rocker Board 3 minutes   Ankle Exercises: Seated   Other Seated Ankle Exercises Ankle isolator 1# 3x10 , circles 1# 2x10   Other Seated Ankle Exercises prostretch x49min for DF                      PT Long Term Goals - 03/02/15 1013    PT LONG TERM GOAL #1   Title Ind with advanced HEP.   Time 12   Period Weeks   Status Achieved   PT LONG TERM GOAL #2   Title Increase right ankle dorsiflexion to 6- 8 degrees to normalize the patient's gait pattern   Time 12   Period Weeks   Status Achieved   PT LONG TERM GOAL #3   Title Increase right ankle strength to 4+ to 5/5 to increase stability for functional tasks   Time 12   Period Weeks   Status On-going   PT LONG TERM GOAL #4   Title Walk a community distance with no gait deviation and pain not > 3/10.   Time 12   Period Weeks   Status New  5-6 /10 with community distance   PT LONG TERM GOAL #5   Title Perform ADL's with pain not > 3/10.   Time 12   Period Weeks   Status On-going  4/10 with ADL's               Plan - 03/02/15  1015    Clinical Impression Statement Patient progresing with activities and able to walk on TM with no pain complaints, although patient reports with ADL's up to 4/10 and pain up to 5-6/10 with community distance. No antalgic gait today. Goals ongoing due to pain and strength deficits.   Pt will benefit from skilled therapeutic intervention in order to improve on the following deficits Abnormal gait;Decreased activity tolerance;Decreased strength;Difficulty walking;Increased edema;Pain;Decreased range of motion   Rehab Potential Good   PT Frequency 2x / week   PT Duration 12 weeks   PT Treatment/Interventions ADLs/Self Care Home Management;Cryotherapy;Radiation protection practitioner;Therapeutic activities;Therapeutic exercise;Neuromuscular re-education;Patient/family education;Manual techniques;Passive range of motion;Vasopneumatic Device   PT Next Visit Plan cont with POC per patient tolerance ( MD. Daiva Eves October 17th)   Consulted and Agree with Plan of Care Patient        Problem List Patient Active  Problem List   Diagnosis Date Noted  . Depression 01/12/2015  . GAD (generalized anxiety disorder) 01/12/2015  . Current smoker 12/17/2014  . Hyperlipemia 07/03/2014  . Vitamin D deficiency 07/03/2014  . Menopause 07/02/2014  . 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 GERD 06/25/2010    Hermelinda Dellen, PTA 03/02/2015, 10:28 AM  [facility] [address] [place], [place], [address] Phone: [phone]   Fax:  [phone]

## 2015-03-04 ENCOUNTER — Other Ambulatory Visit: Payer: 59 | Admitting: Family

## 2015-03-05 ENCOUNTER — Ambulatory Visit: Payer: Self-pay | Admitting: Physical Therapy

## 2015-03-05 ENCOUNTER — Encounter: Payer: Self-pay | Admitting: Physical Therapy

## 2015-03-05 DIAGNOSIS — M25671 Stiffness of right ankle, not elsewhere classified: Secondary | ICD-10-CM

## 2015-03-05 DIAGNOSIS — M25571 Pain in right ankle and joints of right foot: Secondary | ICD-10-CM

## 2015-03-05 DIAGNOSIS — S82891A Other fracture of right lower leg, initial encounter for closed fracture: Secondary | ICD-10-CM

## 2015-03-05 NOTE — Therapy (Signed)
[facility] [address] [place], [place], [address] Phone: [phone]   Fax:  [phone]  Physical Therapy Treatment  Patient Details  Name: Gail Daniels MRN: [id number] Date of Birth: 02-12-1966 Referring Provider:  Junie Spencer, FNP  Encounter Date: 03/05/2015      PT End of Session - 03/05/15 0949    Visit Number 15   Number of Visits 24   Date for PT Re-Evaluation 03/23/15   PT Start Time 0946   PT Stop Time 1009   PT Time Calculation (min) 23 min   Activity Tolerance Patient tolerated treatment well   Behavior During Therapy [facility] for tasks assessed/performed      Past Medical History  Diagnosis Date  . Adult ADHD   . Hypertension   . GERD (gastroesophageal reflux disease)     Past Surgical History  Procedure Laterality Date  . Cesarean section    . Tubal ligation    . Ankle surgery Right     There were no vitals filed for this visit.  Visit Diagnosis:  Stiffness of ankle joint, right  Ankle fracture, right  Right ankle pain      Subjective Assessment - 03/05/15 0948    Subjective Reports ankle feels good in the morning and as the day goes on she has pain. R ankle began hurting last night prior to the rain. Reports she is returning to work soon. Reports a band of tightness around the R ankle that has occured recently with no known cause per patient report.   How long can you walk comfortably? Cannot walk on right foot yet.  NWBing with rolling walker.   Currently in Pain? Yes   Pain Score 4    Pain Location Ankle   Pain Orientation Right;Lateral   Pain Type Acute pain   Pain Onset More than a month ago            [facility] PT Assessment - 03/05/15 0001    Assessment   Medical Diagnosis Right ankle fracture.   Onset Date/Surgical Date 11/13/14   Next MD Visit 03/25/2015                     [facility] Adult PT Treatment/Exercise - 03/05/15 0001    Knee/Hip Exercises: Aerobic   Tread Mill  x6 min at 1.0   Nustep x17 min L5                     PT Long Term Goals - 03/02/15 1013    PT LONG TERM GOAL #1   Title Ind with advanced HEP.   Time 12   Period Weeks   Status Achieved   PT LONG TERM GOAL #2   Title Increase right ankle dorsiflexion to 6- 8 degrees to normalize the patient's gait pattern   Time 12   Period Weeks   Status Achieved   PT LONG TERM GOAL #3   Title Increase right ankle strength to 4+ to 5/5 to increase stability for functional tasks   Time 12   Period Weeks   Status On-going   PT LONG TERM GOAL #4   Title Walk a community distance with no gait deviation and pain not > 3/10.   Time 12   Period Weeks   Status New  5-6 /10 with community distance   PT LONG TERM GOAL #5   Title Perform ADL's with pain not > 3/10.   Time 12   Period  Weeks   Status On-going  4/10 with ADL's               Plan - 03/05/15 1010    Clinical Impression Statement Patient tolerated treatment fairly well but had to left early due to another appointment for new job. Experienced increased discomfort while on treadmill going from midstance to toe off. Presented in clinic today with normal tennis shoes. Experienced 3/10 pain following treatment.   Pt will benefit from skilled therapeutic intervention in order to improve on the following deficits Abnormal gait;Decreased activity tolerance;Decreased strength;Difficulty walking;Increased edema;Pain;Decreased range of motion   Rehab Potential Good   PT Frequency 2x / week   PT Duration 12 weeks   PT Treatment/Interventions ADLs/Self Care Home Management;Cryotherapy;Radiation protection practitioner;Therapeutic activities;Therapeutic exercise;Neuromuscular re-education;Patient/family education;Manual techniques;Passive range of motion;Vasopneumatic Device   PT Next Visit Plan cont with POC per patient tolerance ( MD. Daiva Eves October 12th)   Consulted and Agree with  Plan of Care Patient        Problem List Patient Active Problem List   Diagnosis Date Noted  . Depression 01/12/2015  . GAD (generalized anxiety disorder) 01/12/2015  . Current smoker 12/17/2014  . Hyperlipemia 07/03/2014  . Vitamin D deficiency 07/03/2014  . Menopause 07/02/2014  . 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 GERD 06/25/2010    Evelene Croon, PTA 03/05/2015, 10:15 AM  [facility] [address] [place], [place], [address] Phone: [phone]   Fax:  [phone]

## 2015-03-09 ENCOUNTER — Other Ambulatory Visit: Payer: Self-pay | Admitting: Family

## 2015-03-09 ENCOUNTER — Ambulatory Visit: Payer: Self-pay | Admitting: Physical Therapy

## 2015-03-09 DIAGNOSIS — M25671 Stiffness of right ankle, not elsewhere classified: Secondary | ICD-10-CM

## 2015-03-09 DIAGNOSIS — M25571 Pain in right ankle and joints of right foot: Secondary | ICD-10-CM

## 2015-03-09 DIAGNOSIS — F988 Other specified behavioral and emotional disorders with onset usually occurring in childhood and adolescence: Secondary | ICD-10-CM

## 2015-03-09 NOTE — Therapy (Signed)
[facility] [address] [place], [place], [address] Phone: [phone]   Fax:  [phone]  Physical Therapy Treatment  Patient Details  Name: Gail Daniels MRN: [id number] Date of Birth: May 16, 1966 Referring Provider:  Junie Spencer, FNP  Encounter Date: 03/09/2015      PT End of Session - 03/09/15 7253    Visit Number 16   Number of Visits 24   Date for PT Re-Evaluation 03/23/15   PT Start Time 0820   PT Stop Time 0902   PT Time Calculation (min) 42 min   Activity Tolerance Patient tolerated treatment well   Behavior During Therapy [facility] for tasks assessed/performed      Past Medical History  Diagnosis Date  . Adult ADHD   . Hypertension   . GERD (gastroesophageal reflux disease)     Past Surgical History  Procedure Laterality Date  . Cesarean section    . Tubal ligation    . Ankle surgery Right     There were no vitals filed for this visit.  Visit Diagnosis:  Stiffness of ankle joint, right  Right ankle pain      Subjective Assessment - 03/09/15 0822    Subjective Pt reports ankle pain at 3-4/10 today with it still hurting up to 6-7/10 with ambulation. She returns to MD 03/25/15.   Currently in Pain? Yes   Pain Score 4    Pain Location Ankle   Pain Orientation Right;Lateral   Pain Descriptors / Indicators Sore   Pain Type Acute pain                         [facility] Adult PT Treatment/Exercise - 03/09/15 0001    Knee/Hip Exercises: Aerobic   Nustep L6 x 11   Manual Therapy   Manual Therapy Joint mobilization;Soft tissue mobilization;Myofascial release;Other (comment)   Joint Mobilization tarsal mobs mild discomfort with cuboid; tib/fib/talar mobs   Soft tissue mobilization anterior tib R  very painful   Myofascial Release anterior tib   Passive ROM passive stretch to ant tib   Other Manual Therapy PNF to R ankle    Ankle Exercises: Standing   SLS multiple trials with 2 finger support   Heel Raises 10 reps  then 20 reps on mini tramp in/out toes   Other Standing Ankle Exercises on mini tramp wt shifting, semi tandem bil   Ankle Exercises: Machines for Strengthening   Cybex Leg Press R heel raises 1.5 plate x 20; press 2.5 plates x 20                     PT Long Term Goals - 03/02/15 1013    PT LONG TERM GOAL #1   Title Ind with advanced HEP.   Time 12   Period Weeks   Status Achieved   PT LONG TERM GOAL #2   Title Increase right ankle dorsiflexion to 6- 8 degrees to normalize the patient's gait pattern   Time 12   Period Weeks   Status Achieved   PT LONG TERM GOAL #3   Title Increase right ankle strength to 4+ to 5/5 to increase stability for functional tasks   Time 12   Period Weeks   Status On-going   PT LONG TERM GOAL #4   Title Walk a community distance with no gait deviation and pain not > 3/10.   Time 12   Period Weeks   Status New  5-6 /10  with community distance   PT LONG TERM GOAL #5   Title Perform ADL's with pain not > 3/10.   Time 12   Period Weeks   Status On-going  4/10 with ADL's               Plan - 03/09/15 1607    Clinical Impression Statement Patient tolerated balance activities well today, but has deficits. She also has increased tone and tenderness of R ant tib and weakness with PNF of R ankle. Pain continues with amb.    PT Next Visit Plan continue with balance activities, STW to ant tib, give runners stretch for HEP. MD Daiva Eves 03/25/15        Problem List Patient Active Problem List   Diagnosis Date Noted  . Depression 01/12/2015  . GAD (generalized anxiety disorder) 01/12/2015  . Current smoker 12/17/2014  . Hyperlipemia 07/03/2014  . Vitamin D deficiency 07/03/2014  . Menopause 07/02/2014  . 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 GERD 06/25/2010    Solon Palm PT  03/09/2015, 4:16 PM  [facility] [address] [place], [place], [address] Phone: [phone]   Fax:  [phone]

## 2015-03-09 NOTE — Telephone Encounter (Signed)
Patient last filled 8/30 and last seen by christy 9/12

## 2015-03-11 ENCOUNTER — Ambulatory Visit: Payer: 59 | Admitting: Physical Therapy

## 2015-03-11 ENCOUNTER — Telehealth: Payer: Self-pay | Admitting: Family

## 2015-03-11 DIAGNOSIS — F988 Other specified behavioral and emotional disorders with onset usually occurring in childhood and adolescence: Secondary | ICD-10-CM

## 2015-03-11 DIAGNOSIS — M25571 Pain in right ankle and joints of right foot: Secondary | ICD-10-CM

## 2015-03-11 DIAGNOSIS — M25671 Stiffness of right ankle, not elsewhere classified: Secondary | ICD-10-CM

## 2015-03-11 MED ORDER — AMPHETAMINE-DEXTROAMPHETAMINE 20 MG PO TABS: 20.0000 mg | ORAL_TABLET | Freq: Three times a day (TID) | ORAL | Status: DC

## 2015-03-11 NOTE — Patient Instructions (Addendum)
Achilles / Gastroc, Standing   Stand, right foot behind, heel on floor and turned slightly out, leg straight, forward leg bent. Move hips forward. Hold _30__ seconds. Repeat _3__ times per session. Do _3__ sessions per day.  DORSIFLEXION STRENGTHENING:  Toe Raise (Standing)   Rock back on heels. Repeat _10___ times per set. Do _1-3___ sets per session. Do _2___ sessions per day.  http://orth.exer.us/42   FUNCTIONAL MOBILITY: Heel Walking   Walk forward on heels. _10__ steps per set, _1-3__ sets. 2 times per day. Use assistive device.   SINGLE LIMB STANCE   Stance: single leg on floor. Raise leg. Hold as long as you can. Do 5 times each leg. . _5__ reps per set, 3___ sets per day.  Copyright  VHI. All rights reserved.     Solon Palm, PT 03/11/2015 8:30 AM [facility] [address] [place], [place], [address] Phone: [phone]   Fax:  [phone]

## 2015-03-11 NOTE — Therapy (Signed)
[facility] [address] [place], [place], [address] Phone: [phone]   Fax:  [phone]  Physical Therapy Treatment  Patient Details  Name: Gail Daniels MRN: [id number] Date of Birth: 1965/11/01 Referring Provider:  Junie Spencer, FNP  Encounter Date: 03/11/2015      PT End of Session - 03/11/15 0823    Visit Number 17   Number of Visits 24   Date for PT Re-Evaluation 03/23/15   PT Start Time 0824   PT Stop Time 0906   PT Time Calculation (min) 42 min   Activity Tolerance Patient tolerated treatment well   Behavior During Therapy [facility] for tasks assessed/performed      Past Medical History  Diagnosis Date  . Adult ADHD   . Hypertension   . GERD (gastroesophageal reflux disease)     Past Surgical History  Procedure Laterality Date  . Cesarean section    . Tubal ligation    . Ankle surgery Right     There were no vitals filed for this visit.  Visit Diagnosis:  Stiffness of ankle joint, right  Right ankle pain      Subjective Assessment - 03/11/15 0825    Subjective Patient reports no pain today, but "I haven't done anything yet".   Currently in Pain? No/denies                         [facility] Adult PT Treatment/Exercise - 03/11/15 0001    Knee/Hip Exercises: Stretches   Gastroc Stretch 3 reps;30 seconds   Soleus Stretch 3 reps;30 seconds   Knee/Hip Exercises: Aerobic   Elliptical L3, incline 5 x 10 min   Ankle Exercises: Machines for Strengthening   Cybex Leg Press R heel raises 1.5 plate x 20; press 2.5 plates x 30   Ankle Exercises: Standing   BAPS Level 2  PF/DF and Inv/Ever x 10 each   SLS multiple trials level surface in front of mirror; longest 7 seconds   Heel Raises 15 reps  in/out toes   Ankle Exercises: Seated   Other Seated Ankle Exercises ankle PNF x 10 each  demo'd with red tband for resistance at home                PT Education - 03/11/15 1316    Education  provided Yes   Education Details HEP: gastroc/soleus stretch, SLS, heel walking   Person(s) Educated Patient   Methods Explanation;Demonstration;Handout   Comprehension Returned demonstration;Verbalized understanding             PT Long Term Goals - 03/11/15 0915    PT LONG TERM GOAL #3   Title Increase right ankle strength to 4+ to 5/5 to increase stability for functional tasks   Baseline unable to perform SL heel raise on right   Time 12   Period Weeks   Status On-going   PT LONG TERM GOAL #4   Title Walk a community distance with no gait deviation and pain not > 3/10.   Time 12   Period Weeks   Status On-going   PT LONG TERM GOAL #5   Title Perform ADL's with pain not > 3/10.   Time 12   Period Weeks   Status On-going               Plan - 03/11/15 0911    Clinical Impression Statement Patient performed SLS better today with assist of mirror. Tolerated elliptical without pain. She  continues to experience pain with toe walking/heel raises.   Pt will benefit from skilled therapeutic intervention in order to improve on the following deficits Abnormal gait;Decreased activity tolerance;Decreased strength;Difficulty walking;Increased edema;Pain;Decreased range of motion   Rehab Potential Good   PT Frequency 2x / week   PT Duration 12 weeks   PT Treatment/Interventions ADLs/Self Care Home Management;Cryotherapy;Radiation protection practitioner;Therapeutic activities;Therapeutic exercise;Neuromuscular re-education;Patient/family education;Manual techniques;Passive range of motion;Vasopneumatic Device   PT Next Visit Plan continue with balance activities, STW to ant tib prn, ankle strenghtening, prepare for d/c this week. MD Daiva Eves 03/25/15   Consulted and Agree with Plan of Care Patient        Problem List Patient Active Problem List   Diagnosis Date Noted  . Depression 01/12/2015  . GAD (generalized anxiety disorder)  01/12/2015  . Current smoker 12/17/2014  . Hyperlipemia 07/03/2014  . Vitamin D deficiency 07/03/2014  . Menopause 07/02/2014  . 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 GERD 06/25/2010   Solon Palm PT  03/11/2015, 1:18 PM  [facility] [address] [place], [place], [address] Phone: [phone]   Fax:  [phone]

## 2015-03-12 NOTE — Telephone Encounter (Signed)
Patient informed via voicemail, script left at front desk

## 2015-03-12 NOTE — Telephone Encounter (Signed)
Left message on patient's voicemail to pick up prescription, signed by Dr. Louanne Skye, given to [place] up front.

## 2015-03-16 ENCOUNTER — Ambulatory Visit: Payer: Self-pay | Attending: Orthopedic Surgery | Admitting: Physical Therapy

## 2015-03-16 ENCOUNTER — Ambulatory Visit (INDEPENDENT_AMBULATORY_CARE_PROVIDER_SITE_OTHER): Payer: 59 | Admitting: Family

## 2015-03-16 ENCOUNTER — Encounter: Payer: Self-pay | Admitting: Family

## 2015-03-16 VITALS — BP 132/82 | HR 90 | Temp 97.5°F | Ht 65.0 in | Wt 132.2 lb

## 2015-03-16 DIAGNOSIS — K0889 Other specified disorders of teeth and supporting structures: Secondary | ICD-10-CM

## 2015-03-16 DIAGNOSIS — M25671 Stiffness of right ankle, not elsewhere classified: Secondary | ICD-10-CM | POA: Insufficient documentation

## 2015-03-16 DIAGNOSIS — F411 Generalized anxiety disorder: Secondary | ICD-10-CM

## 2015-03-16 DIAGNOSIS — Z01419 Encounter for gynecological examination (general) (routine) without abnormal findings: Secondary | ICD-10-CM

## 2015-03-16 DIAGNOSIS — K219 Gastro-esophageal reflux disease without esophagitis: Secondary | ICD-10-CM

## 2015-03-16 DIAGNOSIS — F32A Depression, unspecified: Secondary | ICD-10-CM

## 2015-03-16 DIAGNOSIS — Z0000 Encounter for general adult medical examination without abnormal findings: Secondary | ICD-10-CM

## 2015-03-16 DIAGNOSIS — Z780 Asymptomatic menopausal state: Secondary | ICD-10-CM

## 2015-03-16 DIAGNOSIS — F329 Major depressive disorder, single episode, unspecified: Secondary | ICD-10-CM

## 2015-03-16 DIAGNOSIS — F988 Other specified behavioral and emotional disorders with onset usually occurring in childhood and adolescence: Secondary | ICD-10-CM

## 2015-03-16 DIAGNOSIS — E559 Vitamin D deficiency, unspecified: Secondary | ICD-10-CM

## 2015-03-16 DIAGNOSIS — M25571 Pain in right ankle and joints of right foot: Secondary | ICD-10-CM | POA: Insufficient documentation

## 2015-03-16 DIAGNOSIS — S82891A Other fracture of right lower leg, initial encounter for closed fracture: Secondary | ICD-10-CM | POA: Insufficient documentation

## 2015-03-16 DIAGNOSIS — E785 Hyperlipidemia, unspecified: Secondary | ICD-10-CM

## 2015-03-16 DIAGNOSIS — F17200 Nicotine dependence, unspecified, uncomplicated: Secondary | ICD-10-CM

## 2015-03-16 LAB — POCT GLYCOSYLATED HEMOGLOBIN (HGB A1C): Hemoglobin A1C: 5.4

## 2015-03-16 MED ORDER — HYDROCODONE-ACETAMINOPHEN 5-325 MG PO TABS: 1.0000 | ORAL_TABLET | Freq: Four times a day (QID) | ORAL | Status: DC | PRN

## 2015-03-16 NOTE — Patient Instructions (Signed)

## 2015-03-16 NOTE — Progress Notes (Signed)
Subjective:    Patient ID: Gail Daniels, female    DOB: 02/10/1966, 49 y.o.   MRN: [id number]  Pt presents to the office today for CPE with pap.  Gynecologic Exam Pertinent negatives include no headaches.  Gastrophageal Reflux She complains of belching. She reports no coughing, no heartburn or no tooth decay. This is a chronic problem. The current episode started more than 1 year ago. The problem occurs frequently. The problem has been waxing and waning. The symptoms are aggravated by certain foods and lying down. Pertinent negatives include no fatigue. She has tried a PPI for the symptoms.  Anxiety Symptoms include excessive worry and nervous/anxious behavior. Patient reports no decreased concentration, depressed mood, insomnia, muscle tension, palpitations or shortness of breath. Symptoms occur occasionally. The quality of sleep is good.   Her past medical history is significant for anxiety/panic attacks and depression. There is no history of CAD. Past treatments include benzodiazephines and SSRIs. Compliance with prior treatments has been good.  Depression      The patient presents with depression.  This is a chronic problem.  The current episode started more than 1 year ago.   The problem occurs intermittently.  The problem has been waxing and waning since onset.  Associated symptoms include no decreased concentration, no fatigue, no helplessness, no hopelessness, does not have insomnia, not irritable, no decreased interest, no headaches and not sad.  Past treatments include SSRIs - Selective serotonin reuptake inhibitors.  Compliance with treatment is good.  Past medical history includes anxiety and depression.       Review of Systems  Constitutional: Negative.  Negative for fatigue.  HENT: Negative.   Eyes: Negative.   Respiratory: Negative.  Negative for cough and shortness of breath.   Cardiovascular: Negative.  Negative for palpitations.  Gastrointestinal: Negative.  Negative for  heartburn.  Endocrine: Negative.   Genitourinary: Negative.   Musculoskeletal: Negative.   Neurological: Negative.  Negative for headaches.  Hematological: Negative.   Psychiatric/Behavioral: Positive for depression. Negative for decreased concentration. The patient is nervous/anxious. The patient does not have insomnia.   All other systems reviewed and are negative.      Objective:   Physical Exam  Constitutional: She is oriented to person, place, and time. She appears well-developed and well-nourished. She is not irritable. No distress.  HENT:  Head: Normocephalic and atraumatic.  Right Ear: External ear normal.  Left Ear: External ear normal.  Nose: Nose normal.  Mouth/Throat: Oropharynx is clear and moist.  Eyes: Pupils are equal, round, and reactive to light.  Neck: Normal range of motion. Neck supple. No thyromegaly present.  Cardiovascular: Normal rate, regular rhythm, normal heart sounds and intact distal pulses.   No murmur heard. Pulmonary/Chest: Effort normal and breath sounds normal. No respiratory distress. She has no wheezes. Right breast exhibits no inverted nipple, no mass, no nipple discharge, no skin change and no tenderness. Left breast exhibits no inverted nipple, no mass, no nipple discharge, no skin change and no tenderness. Breasts are symmetrical.  Abdominal: Soft. Bowel sounds are normal. She exhibits no distension. There is no tenderness.  Genitourinary: Vagina normal. No vaginal discharge found.  Bimanual exam- no adnexal masses or tenderness, ovaries nonpalpable   Cervix parous and pink- No discharge   Musculoskeletal: Normal range of motion. She exhibits no edema or tenderness.  Neurological: She is alert and oriented to person, place, and time. She has normal reflexes. No cranial nerve deficit.  Skin: Skin is warm and dry.  Psychiatric: She has a normal mood and affect. Her behavior is normal. Judgment and thought content normal.  Vitals  reviewed.   BP 132/82 mmHg  Pulse 90  Temp(Src) 97.5 F (36.4 C) (Oral)  Ht _0  (1.651 m)  Wt 132 lb 3.2 oz (59.966 kg)  BMI 22.00 kg/m2       Assessment & Plan:  1. Gastroesophageal reflux disease, esophagitis presence not specified - CMP14+EGFR  2. ADD (attention deficit disorder) without hyperactivity - CMP14+EGFR  3. Hyperlipemia - CMP14+EGFR - Thyroid Panel With TSH  4. Vitamin D deficiency - CMP14+EGFR  5. Current smoker - CMP14+EGFR  6. GAD (generalized anxiety disorder) - CMP14+EGFR  7. Depression - CMP14+EGFR  8. Menopause - CMP14+EGFR  9. Pain, dental - CMP14+EGFR - HYDROcodone-acetaminophen (NORCO/VICODIN) 5-325 MG tablet; Take 1 tablet by mouth every 6 (six) hours as needed for moderate pain.  Dispense: 30 tablet; Refill: 0  10. Laboratory tests ordered as part of a complete physical exam (CPE) - POCT glycosylated hemoglobin (Hb A1C) - CMP14+EGFR - Lipid panel - Thyroid Panel With TSH - Vit D  25 hydroxy (rtn osteoporosis monitoring) - Pap IG w/ reflex to HPV when ASC-U  11. Encounter for routine gynecological examination - CMP14+EGFR - Pap IG w/ reflex to HPV when ASC-U   Continue all meds Labs pending Health Maintenance reviewed-Pt has mammogram scheduled in December Diet and exercise encouraged RTO 3 months  Evelina Dun, FNP

## 2015-03-16 NOTE — Therapy (Signed)
[facility] [address] [place], [place], [address] Phone: [phone]   Fax:  [phone]  Physical Therapy Treatment  Patient Details  Name: Gail Daniels MRN: [id number] Date of Birth: [birth year] Referring Provider:  Junie Spencer, FNP  Encounter Date: 03/16/2015      PT End of Session - 03/16/15 1009    Visit Number 18   Number of Visits 24   Date for PT Re-Evaluation 03/23/15   PT Start Time 0900   PT Stop Time 1001   PT Time Calculation (min) 61 min   Activity Tolerance Patient tolerated treatment well   Behavior During Therapy [facility] for tasks assessed/performed      Past Medical History  Diagnosis Date  . Adult ADHD   . Hypertension   . GERD (gastroesophageal reflux disease)     Past Surgical History  Procedure Laterality Date  . Cesarean section    . Tubal ligation    . Ankle surgery Right     There were no vitals filed for this visit.  Visit Diagnosis:  Stiffness of ankle joint, right  Right ankle pain  Ankle fracture, right      Subjective Assessment - 03/16/15 1008    Subjective Pain increases on outside of ankle the longer i am up.   Pain Score 3    Pain Location Ankle   Pain Orientation Right;Lateral   Pain Descriptors / Indicators Aching;Sore                                      PT Long Term Goals - 03/16/15 1013    PT LONG TERM GOAL #1   Title Ind with advanced HEP.   Time 12   Period Weeks   Status Achieved   PT LONG TERM GOAL #2   Time 12   Status Achieved   PT LONG TERM GOAL #3   Title Increase right ankle strength to 4+ to 5/5 to increase stability for functional tasks   Baseline unable to perform SL heel raise on right   Time 12   Period Weeks   Status On-going   PT LONG TERM GOAL #4   Title Walk a community distance with no gait deviation and pain not > 3/10.   Time 12   Period Weeks   Status On-going  Right lateral ankle pain rises to 6-7/10  with increased walking.   PT LONG TERM GOAL #5   Title Perform ADL's with pain not > 3/10.   Time 12   Period Weeks   Status On-going      Treatment:  Nustep level 4 x 15 minutes; Elliptictal L3/L3 x 10 minutes; Leg Press 2 plates x 5 minutes and Rockerboard x 5 minutes; Dynadisc (right ankle) x 2 minutes.  STW/M to right ankle lateral incisional site x 2 minutes f/b constant pre-mod e' stim x 15 minutes.  Excellent job today.          Plan - 03/16/15 1011    Clinical Impression Statement The patient is very pleased with her overall progress.  She does, however, report right lateral ankle pain approaching 6-7/10 the longer she is up walking.   Pt will benefit from skilled therapeutic intervention in order to improve on the following deficits Abnormal gait;Decreased activity tolerance;Decreased strength;Difficulty walking;Increased edema;Pain;Decreased range of motion   Rehab Potential Good   PT Frequency 2x / week  PT Duration 12 weeks   PT Next Visit Plan continue with balance activities, STW to ant tib prn, ankle strenghtening, prepare for d/c this week. MD Daiva Eves 03/25/15        Problem List Patient Active Problem List   Diagnosis Date Noted  . Depression 01/12/2015  . GAD (generalized anxiety disorder) 01/12/2015  . Current smoker 12/17/2014  . Hyperlipemia 07/03/2014  . Vitamin D deficiency 07/03/2014  . Menopause 07/02/2014  . 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 GERD 06/25/2010    Samer Dutton, [place] MPT 03/16/2015, 10:14 AM  [facility] [address] [place], [place], [address] Phone: [phone]   Fax:  [phone]

## 2015-03-17 LAB — VITAMIN D 25 HYDROXY (VIT D DEFICIENCY, FRACTURES): VIT D 25 HYDROXY: 21.3 ng/mL — AB (ref 30.0–100.0)

## 2015-03-17 LAB — CMP14+EGFR
A/G RATIO: 1.8 (ref 1.1–2.5)
ALBUMIN: 4.2 g/dL (ref 3.5–5.5)
ALK PHOS: 89 IU/L (ref 39–117)
ALT: 8 IU/L (ref 0–32)
AST: 11 IU/L (ref 0–40)
BUN / CREAT RATIO: 19 (ref 9–23)
BUN: 19 mg/dL (ref 6–24)
CHLORIDE: 104 mmol/L (ref 97–108)
CO2: 23 mmol/L (ref 18–29)
Calcium: 8.9 mg/dL (ref 8.7–10.2)
Creatinine, Ser: 1.01 mg/dL — ABNORMAL HIGH (ref 0.57–1.00)
GFR calc non Af Amer: 66 mL/min/{1.73_m2} (ref >59)
GFR, EST AFRICAN AMERICAN: 76 mL/min/{1.73_m2} (ref >59)
GLOBULIN, TOTAL: 2.3 g/dL (ref 1.5–4.5)
GLUCOSE: 97 mg/dL (ref 65–99)
POTASSIUM: 3.9 mmol/L (ref 3.5–5.2)
SODIUM: 142 mmol/L (ref 134–144)
TOTAL PROTEIN: 6.5 g/dL (ref 6.0–8.5)

## 2015-03-17 LAB — THYROID PANEL WITH TSH
FREE THYROXINE INDEX: 2.9 (ref 1.2–4.9)
T3 UPTAKE RATIO: 27 % (ref 24–39)
T4 TOTAL: 10.9 ug/dL (ref 4.5–12.0)
TSH: 0.785 u[IU]/mL (ref 0.450–4.500)

## 2015-03-17 LAB — LIPID PANEL
CHOLESTEROL TOTAL: 164 mg/dL (ref 100–199)
Chol/HDL Ratio: 2.7 ratio units (ref 0.0–4.4)
HDL: 60 mg/dL (ref >39)
LDL Calculated: 89 mg/dL (ref 0–99)
Triglycerides: 76 mg/dL (ref 0–149)
VLDL CHOLESTEROL CAL: 15 mg/dL (ref 5–40)

## 2015-03-19 ENCOUNTER — Ambulatory Visit: Payer: Self-pay | Admitting: Physical Therapy

## 2015-03-19 ENCOUNTER — Other Ambulatory Visit: Payer: Self-pay | Admitting: Family

## 2015-03-19 LAB — PAP IG W/ RFLX HPV ASCU: PAP SMEAR COMMENT: 0

## 2015-03-19 MED ORDER — FLUCONAZOLE 150 MG PO TABS: 150.0000 mg | ORAL_TABLET | Freq: Once | ORAL | Status: DC

## 2015-03-19 MED ORDER — VITAMIN D (ERGOCALCIFEROL) 1.25 MG (50000 UNIT) PO CAPS: 50000.0000 [IU] | ORAL_CAPSULE | ORAL | Status: DC

## 2015-04-04 ENCOUNTER — Other Ambulatory Visit: Payer: Self-pay | Admitting: Family Medicine

## 2015-04-04 ENCOUNTER — Other Ambulatory Visit: Payer: Self-pay | Admitting: Family

## 2015-04-04 DIAGNOSIS — F988 Other specified behavioral and emotional disorders with onset usually occurring in childhood and adolescence: Secondary | ICD-10-CM

## 2015-04-04 DIAGNOSIS — K0889 Other specified disorders of teeth and supporting structures: Secondary | ICD-10-CM

## 2015-04-07 MED ORDER — HYDROCODONE-ACETAMINOPHEN 5-325 MG PO TABS: 1.0000 | ORAL_TABLET | Freq: Four times a day (QID) | ORAL | Status: DC | PRN

## 2015-04-07 MED ORDER — AMPHETAMINE-DEXTROAMPHETAMINE 20 MG PO TABS: 20.0000 mg | ORAL_TABLET | Freq: Three times a day (TID) | ORAL | Status: DC

## 2015-04-07 NOTE — Addendum Note (Signed)
Addended by: Jannifer RodneyHAWKS, Gwyn Mehring A on: 04/07/2015 10:07 AM   Modules accepted: Orders

## 2015-04-07 NOTE — Telephone Encounter (Signed)
Patient came to office to get scripts.

## 2015-04-07 NOTE — Telephone Encounter (Signed)
RX ready for pick up 

## 2015-05-04 ENCOUNTER — Encounter: Payer: Self-pay | Admitting: Family

## 2015-05-04 ENCOUNTER — Other Ambulatory Visit: Payer: Self-pay | Admitting: Family

## 2015-05-04 DIAGNOSIS — K0889 Other specified disorders of teeth and supporting structures: Secondary | ICD-10-CM

## 2015-05-04 DIAGNOSIS — F988 Other specified behavioral and emotional disorders with onset usually occurring in childhood and adolescence: Secondary | ICD-10-CM

## 2015-05-04 MED ORDER — HYDROCODONE-ACETAMINOPHEN 5-325 MG PO TABS: 1.0000 | ORAL_TABLET | Freq: Four times a day (QID) | ORAL | Status: DC | PRN

## 2015-05-04 MED ORDER — AMPHETAMINE-DEXTROAMPHETAMINE 20 MG PO TABS: 20.0000 mg | ORAL_TABLET | Freq: Three times a day (TID) | ORAL | Status: DC

## 2015-05-04 NOTE — Telephone Encounter (Signed)
RX ready for pick up 

## 2015-05-04 NOTE — Telephone Encounter (Signed)
Your two scripts are ready.

## 2015-05-25 ENCOUNTER — Ambulatory Visit: Payer: 59 | Admitting: Family

## 2015-05-26 ENCOUNTER — Encounter: Payer: Self-pay | Admitting: Family

## 2015-05-31 ENCOUNTER — Encounter: Payer: Self-pay | Admitting: Family

## 2015-05-31 ENCOUNTER — Other Ambulatory Visit: Payer: Self-pay | Admitting: Family

## 2015-05-31 DIAGNOSIS — F988 Other specified behavioral and emotional disorders with onset usually occurring in childhood and adolescence: Secondary | ICD-10-CM

## 2015-05-31 DIAGNOSIS — K0889 Other specified disorders of teeth and supporting structures: Secondary | ICD-10-CM

## 2015-06-01 MED ORDER — PANTOPRAZOLE SODIUM 40 MG PO TBEC: 40.0000 mg | DELAYED_RELEASE_TABLET | Freq: Two times a day (BID) | ORAL | Status: DC

## 2015-06-02 ENCOUNTER — Other Ambulatory Visit: Payer: Self-pay | Admitting: Family

## 2015-06-02 MED ORDER — AMPHETAMINE-DEXTROAMPHETAMINE 20 MG PO TABS: 20.0000 mg | ORAL_TABLET | Freq: Three times a day (TID) | ORAL | Status: DC

## 2015-06-02 MED ORDER — HYDROCODONE-ACETAMINOPHEN 5-325 MG PO TABS: 1.0000 | ORAL_TABLET | Freq: Four times a day (QID) | ORAL | Status: DC | PRN

## 2015-06-02 NOTE — Telephone Encounter (Signed)
Pool nurse has completed this.

## 2015-06-02 NOTE — Telephone Encounter (Signed)
RX ready for pick up 

## 2015-06-12 ENCOUNTER — Encounter: Payer: Self-pay | Admitting: Family

## 2015-06-12 ENCOUNTER — Ambulatory Visit (INDEPENDENT_AMBULATORY_CARE_PROVIDER_SITE_OTHER): Payer: 59 | Admitting: Family

## 2015-06-12 VITALS — BP 137/91 | HR 88 | Temp 97.3°F | Ht 65.0 in | Wt 137.0 lb

## 2015-06-12 DIAGNOSIS — K047 Periapical abscess without sinus: Secondary | ICD-10-CM

## 2015-06-12 DIAGNOSIS — Z720 Tobacco use: Secondary | ICD-10-CM

## 2015-06-12 DIAGNOSIS — Z23 Encounter for immunization: Secondary | ICD-10-CM

## 2015-06-12 DIAGNOSIS — F988 Other specified behavioral and emotional disorders with onset usually occurring in childhood and adolescence: Secondary | ICD-10-CM

## 2015-06-12 DIAGNOSIS — F329 Major depressive disorder, single episode, unspecified: Secondary | ICD-10-CM | POA: Diagnosis not present

## 2015-06-12 DIAGNOSIS — F32A Depression, unspecified: Secondary | ICD-10-CM

## 2015-06-12 DIAGNOSIS — E559 Vitamin D deficiency, unspecified: Secondary | ICD-10-CM | POA: Diagnosis not present

## 2015-06-12 DIAGNOSIS — F411 Generalized anxiety disorder: Secondary | ICD-10-CM | POA: Diagnosis not present

## 2015-06-12 DIAGNOSIS — E785 Hyperlipidemia, unspecified: Secondary | ICD-10-CM | POA: Diagnosis not present

## 2015-06-12 DIAGNOSIS — F900 Attention-deficit hyperactivity disorder, predominantly inattentive type: Secondary | ICD-10-CM | POA: Diagnosis not present

## 2015-06-12 DIAGNOSIS — K219 Gastro-esophageal reflux disease without esophagitis: Secondary | ICD-10-CM

## 2015-06-12 DIAGNOSIS — K0889 Other specified disorders of teeth and supporting structures: Secondary | ICD-10-CM | POA: Diagnosis not present

## 2015-06-12 DIAGNOSIS — F17200 Nicotine dependence, unspecified, uncomplicated: Secondary | ICD-10-CM

## 2015-06-12 DIAGNOSIS — Z780 Asymptomatic menopausal state: Secondary | ICD-10-CM | POA: Diagnosis not present

## 2015-06-12 LAB — HM MAMMOGRAPHY

## 2015-06-12 MED ORDER — PANTOPRAZOLE SODIUM 40 MG PO TBEC: 40.0000 mg | DELAYED_RELEASE_TABLET | Freq: Two times a day (BID) | ORAL | Status: AC

## 2015-06-12 MED ORDER — AMOXICILLIN 875 MG PO TABS: 875.0000 mg | ORAL_TABLET | Freq: Two times a day (BID) | ORAL | Status: DC

## 2015-06-12 MED ORDER — HYDROCODONE-ACETAMINOPHEN 5-325 MG PO TABS: 1.0000 | ORAL_TABLET | Freq: Four times a day (QID) | ORAL | Status: DC | PRN

## 2015-06-12 MED ORDER — CONJ ESTROG-MEDROXYPROGEST ACE 0.3-1.5 MG PO TABS: 1.0000 | ORAL_TABLET | Freq: Every day | ORAL | Status: AC

## 2015-06-12 NOTE — Progress Notes (Signed)
Subjective:    Patient ID: Gail Daniels, female    DOB: [birth year], 49 y.o.   MRN: [id number]  Pt presents to the office today for chronic follow up. PT is complaining of left tooth pain. Pt states she has appt with her dentists   on 06/25/15. Pt states she feels like her tooth may be infected. PT states she is having constant throbbing pain of a 4-10 out 10.  Hyperlipidemia This is a chronic problem. The current episode started more than 1 year ago. The problem is controlled. Recent lipid tests were reviewed and are normal. She has no history of diabetes. Factors aggravating her hyperlipidemia include smoking. Pertinent negatives include no shortness of breath. Current antihyperlipidemic treatment includes statins. The current treatment provides moderate improvement of lipids. Risk factors for coronary artery disease include dyslipidemia, hypertension and a sedentary lifestyle.  Gastroesophageal Reflux She complains of belching. She reports no coughing, no heartburn or no tooth decay. This is a chronic problem. The current episode started more than 1 year ago. The problem occurs frequently. The problem has been waxing and waning. The symptoms are aggravated by certain foods and lying down. Pertinent negatives include no fatigue. She has tried a PPI for the symptoms. The treatment provided significant relief.  Anxiety Presents for follow-up visit. Symptoms include excessive worry and nervous/anxious behavior. Patient reports no decreased concentration, depressed mood, insomnia, muscle tension, palpitations or shortness of breath. Symptoms occur occasionally. The quality of sleep is good.   Her past medical history is significant for anxiety/panic attacks and depression. There is no history of CAD. Past treatments include benzodiazephines and SSRIs. Compliance with prior treatments has been good.  Depression      The patient presents with depression.  This is a chronic problem.  The current episode  started more than 1 year ago.   The problem occurs intermittently.  The problem has been waxing and waning since onset.  Associated symptoms include no decreased concentration, no fatigue, no helplessness, no hopelessness, does not have insomnia, not irritable, no decreased interest, no headaches and not sad.  Past treatments include SSRIs - Selective serotonin reuptake inhibitors.  Compliance with treatment is good.  Past medical history includes anxiety and depression.   ADHD PT currently taking adderall 20 mg for ADHD and states she is doing well with no complaints. Pt states she has one more semester left of school to get her Associates.     Review of Systems  Constitutional: Negative.  Negative for fatigue.  HENT: Negative.   Eyes: Negative.   Respiratory: Negative.  Negative for cough and shortness of breath.   Cardiovascular: Negative.  Negative for palpitations.  Gastrointestinal: Negative.  Negative for heartburn.  Endocrine: Negative.   Genitourinary: Negative.   Musculoskeletal: Negative.   Neurological: Negative.  Negative for headaches.  Hematological: Negative.   Psychiatric/Behavioral: Positive for depression. Negative for decreased concentration. The patient is nervous/anxious. The patient does not have insomnia.   All other systems reviewed and are negative.      Objective:   Physical Exam  Constitutional: She is oriented to person, place, and time. She appears well-developed and well-nourished. She is not irritable. No distress.  HENT:  Head: Normocephalic and atraumatic.  Right Ear: External ear normal.  Left Ear: External ear normal.  Nose: Nose normal.  Mouth/Throat: Oropharynx is clear and moist.  Eyes: Pupils are equal, round, and reactive to light.  Neck: Normal range of motion. Neck supple. No thyromegaly present.  Cardiovascular: Normal  rate, regular rhythm, normal heart sounds and intact distal pulses.   No murmur heard. Pulmonary/Chest: Effort normal  and breath sounds normal. No respiratory distress. She has no wheezes.  Abdominal: Soft. Bowel sounds are normal. She exhibits no distension. There is no tenderness.  Musculoskeletal: Normal range of motion. She exhibits no edema or tenderness.  Neurological: She is alert and oriented to person, place, and time. She has normal reflexes. No cranial nerve deficit.  Skin: Skin is warm and dry.  Psychiatric: She has a normal mood and affect. Her behavior is normal. Judgment and thought content normal.  Vitals reviewed.     BP 137/91 mmHg  Pulse 88  Temp(Src) 97.3 F (36.3 C) (Oral)  Ht 5' 5"  (1.651 m)  Wt 137 lb (62.143 kg)  BMI 22.80 kg/m2     Assessment & Plan:  1. Gastroesophageal reflux disease, esophagitis presence not specified - CMP14+EGFR - pantoprazole (PROTONIX) 40 MG tablet; Take 1 tablet (40 mg total) by mouth 2 (two) times daily.  Dispense: 180 tablet; Refill: 3  2. Vitamin D deficiency - CMP14+EGFR - VITAMIN D 25 Hydroxy (Vit-D Deficiency, Fractures)  3. Menopause - CMP14+EGFR - estrogen, conjugated,-medroxyprogesterone (PREMPRO) 0.3-1.5 MG tablet; Take 1 tablet by mouth daily.  Dispense: 90 tablet; Refill: 1  4. Hyperlipemia - CMP14+EGFR - Lipid panel  5. Depression - CMP14+EGFR  6. 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 Current smoker - CMP14+EGFR  9. Pain, denta - CMP14+EGFR - HYDROcodone-acetaminophen (NORCO/VICODIN) 5-325 MG tablet; Take 1 tablet by mouth every 6 (six) hours as needed for moderate pain.  Dispense: 30 tablet; Refill: 0  10. Abscessed tooth - CMP14+EGFR - HYDROcodone-acetaminophen (NORCO/VICODIN) 5-325 MG tablet; Take 1 tablet by mouth every 6 (six) hours as needed for moderate pain.  Dispense: 30 tablet; Refill: 0 - amoxicillin (AMOXIL) 875 MG tablet; Take 1 tablet (875 mg total) by mouth 2 (two) times daily.  Dispense: 20 tablet; Refill:  0   Continue all meds Labs pending Health Maintenance reviewed Diet and exercise encouraged RTO 3 months  Evelina Dun, FNP

## 2015-06-12 NOTE — Patient Instructions (Signed)
Health Maintenance, Female Adopting a healthy lifestyle and getting preventive care can go a long way to promote health and wellness. Talk with your health care provider about what schedule of regular examinations is right for you. This is a good chance for you to check in with your provider about disease prevention and staying healthy. In between checkups, there are plenty of things you can do on your own. Experts have done a lot of research about which lifestyle changes and preventive measures are most likely to keep you healthy. Ask your health care provider for more information. WEIGHT AND DIET  Eat a healthy diet  Be sure to include plenty of vegetables, fruits, low-fat dairy products, and lean protein.  Do not eat a lot of foods high in solid fats, added sugars, or salt.  Get regular exercise. This is one of the most important things you can do for your health.  Most adults should exercise for at least 150 minutes each week. The exercise should increase your heart rate and make you sweat (moderate-intensity exercise).  Most adults should also do strengthening exercises at least twice a week. This is in addition to the moderate-intensity exercise.  Maintain a healthy weight  Body mass index (BMI) is a measurement that can be used to identify possible weight problems. It estimates body fat based on height and weight. Your health care provider can help determine your BMI and help you achieve or maintain a healthy weight.  For females 20 years of age and older:   A BMI below 18.5 is considered underweight.  A BMI of 18.5 to 24.9 is normal.  A BMI of 25 to 29.9 is considered overweight.  A BMI of 30 and above is considered obese.  Watch levels of cholesterol and blood lipids  You should start having your blood tested for lipids and cholesterol at 49 years of age, then have this test every 5 years.  You may need to have your cholesterol levels checked more often if:  Your lipid  or cholesterol levels are high.  You are older than 50 years of age.  You are at high risk for heart disease.  CANCER SCREENING   Lung Cancer  Lung cancer screening is recommended for adults 55-80 years old who are at high risk for lung cancer because of a history of smoking.  A yearly low-dose CT scan of the lungs is recommended for people who:  Currently smoke.  Have quit within the past 15 years.  Have at least a 30-pack-year history of smoking. A pack year is smoking an average of one pack of cigarettes a day for 1 year.  Yearly screening should continue until it has been 15 years since you quit.  Yearly screening should stop if you develop a health problem that would prevent you from having lung cancer treatment.  Breast Cancer  Practice breast self-awareness. This means understanding how your breasts normally appear and feel.  It also means doing regular breast self-exams. Let your health care provider know about any changes, no matter how small.  If you are in your 20s or 30s, you should have a clinical breast exam (CBE) by a health care provider every 1-3 years as part of a regular health exam.  If you are 40 or older, have a CBE every year. Also consider having a breast X-ray (mammogram) every year.  If you have a family history of breast cancer, talk to your health care provider about genetic screening.  If you   are at high risk for breast cancer, talk to your health care provider about having an MRI and a mammogram every year.  Breast cancer gene (BRCA) assessment is recommended for women who have family members with BRCA-related cancers. BRCA-related cancers include:  Breast.  Ovarian.  Tubal.  Peritoneal cancers.  Results of the assessment will determine the need for genetic counseling and BRCA1 and BRCA2 testing. Cervical Cancer Your health care provider may recommend that you be screened regularly for cancer of the pelvic organs (ovaries, uterus, and  vagina). This screening involves a pelvic examination, including checking for microscopic changes to the surface of your cervix (Pap test). You may be encouraged to have this screening done every 3 years, beginning at age 21.  For women ages 30-65, health care providers may recommend pelvic exams and Pap testing every 3 years, or they may recommend the Pap and pelvic exam, combined with testing for human papilloma virus (HPV), every 5 years. Some types of HPV increase your risk of cervical cancer. Testing for HPV may also be done on women of any age with unclear Pap test results.  Other health care providers may not recommend any screening for nonpregnant women who are considered low risk for pelvic cancer and who do not have symptoms. Ask your health care provider if a screening pelvic exam is right for you.  If you have had past treatment for cervical cancer or a condition that could lead to cancer, you need Pap tests and screening for cancer for at least 20 years after your treatment. If Pap tests have been discontinued, your risk factors (such as having a new sexual partner) need to be reassessed to determine if screening should resume. Some women have medical problems that increase the chance of getting cervical cancer. In these cases, your health care provider may recommend more frequent screening and Pap tests. Colorectal Cancer  This type of cancer can be detected and often prevented.  Routine colorectal cancer screening usually begins at 50 years of age and continues through 49 years of age.  Your health care provider may recommend screening at an earlier age if you have risk factors for colon cancer.  Your health care provider may also recommend using home test kits to check for hidden blood in the stool.  A small camera at the end of a tube can be used to examine your colon directly (sigmoidoscopy or colonoscopy). This is done to check for the earliest forms of colorectal  cancer.  Routine screening usually begins at age 50.  Direct examination of the colon should be repeated every 5-10 years through 49 years of age. However, you may need to be screened more often if early forms of precancerous polyps or small growths are found. Skin Cancer  Check your skin from head to toe regularly.  Tell your health care provider about any new moles or changes in moles, especially if there is a change in a mole's shape or color.  Also tell your health care provider if you have a mole that is larger than the size of a pencil eraser.  Always use sunscreen. Apply sunscreen liberally and repeatedly throughout the day.  Protect yourself by wearing long sleeves, pants, a wide-brimmed hat, and sunglasses whenever you are outside. HEART DISEASE, DIABETES, AND HIGH BLOOD PRESSURE   High blood pressure causes heart disease and increases the risk of stroke. High blood pressure is more likely to develop in:  People who have blood pressure in the high end   of the normal range (130-139/85-89 mm Hg).  People who are overweight or obese.  People who are African American.  If you are 6-32 years of age, have your blood pressure checked every 3-5 years. If you are 50 years of age or older, have your blood pressure checked every year. You should have your blood pressure measured twice--once when you are at a hospital or clinic, and once when you are not at a hospital or clinic. Record the average of the two measurements. To check your blood pressure when you are not at a hospital or clinic, you can use:  An automated blood pressure machine at a pharmacy.  A home blood pressure monitor.  If you are between 99 years and 68 years old, ask your health care provider if you should take aspirin to prevent strokes.  Have regular diabetes screenings. This involves taking a blood sample to check your fasting blood sugar level.  If you are at a normal weight and have a low risk for diabetes,  have this test once every three years after 49 years of age.  If you are overweight and have a high risk for diabetes, consider being tested at a younger age or more often. PREVENTING INFECTION  Hepatitis B  If you have a higher risk for hepatitis B, you should be screened for this virus. You are considered at high risk for hepatitis B if:  You were born in a country where hepatitis B is common. Ask your health care provider which countries are considered high risk.  Your parents were born in a high-risk country, and you have not been immunized against hepatitis B (hepatitis B vaccine).  You have HIV or AIDS.  You use needles to inject street drugs.  You live with someone who has hepatitis B.  You have had sex with someone who has hepatitis B.  You get hemodialysis treatment.  You take certain medicines for conditions, including cancer, organ transplantation, and autoimmune conditions. Hepatitis C  Blood testing is recommended for:  Everyone born from [birth year] through 1965.  Anyone with known risk factors for hepatitis C. Sexually transmitted infections (STIs)  You should be screened for sexually transmitted infections (STIs) including gonorrhea and chlamydia if:  You are sexually active and are younger than 49 years of age.  You are older than 49 years of age and your health care provider tells you that you are at risk for this type of infection.  Your sexual activity has changed since you were last screened and you are at an increased risk for chlamydia or gonorrhea. Ask your health care provider if you are at risk.  If you do not have HIV, but are at risk, it may be recommended that you take a prescription medicine daily to prevent HIV infection. This is called pre-exposure prophylaxis (PrEP). You are considered at risk if:  You are sexually active and do not regularly use condoms or know the HIV status of your partner(s).  You take drugs by injection.  You are sexually  active with a partner who has HIV. Talk with your health care provider about whether you are at high risk of being infected with HIV. If you choose to begin PrEP, you should first be tested for HIV. You should then be tested every 3 months for as long as you are taking PrEP.  PREGNANCY   If you are premenopausal and you may become pregnant, ask your health care provider about preconception counseling.  If you may  become pregnant, take 400 to 800 micrograms (mcg) of folic acid every day.  If you want to prevent pregnancy, talk to your health care provider about birth control (contraception). OSTEOPOROSIS AND MENOPAUSE   Osteoporosis is a disease in which the bones lose minerals and strength with aging. This can result in serious bone fractures. Your risk for osteoporosis can be identified using a bone density scan.  If you are 9 years of age or older, or if you are at risk for osteoporosis and fractures, ask your health care provider if you should be screened.  Ask your health care provider whether you should take a calcium or vitamin D supplement to lower your risk for osteoporosis.  Menopause may have certain physical symptoms and risks.  Hormone replacement therapy may reduce some of these symptoms and risks. Talk to your health care provider about whether hormone replacement therapy is right for you.  HOME CARE INSTRUCTIONS   Schedule regular health, dental, and eye exams.  Stay current with your immunizations.   Do not use any tobacco products including cigarettes, chewing tobacco, or electronic cigarettes.  If you are pregnant, do not drink alcohol.  If you are breastfeeding, limit how much and how often you drink alcohol.  Limit alcohol intake to no more than 1 drink per day for nonpregnant women. One drink equals 12 ounces of beer, 5 ounces of wine, or 1 ounces of hard liquor.  Do not use street drugs.  Do not share needles.  Ask your health care provider for help if  you need support or information about quitting drugs.  Tell your health care provider if you often feel depressed.  Tell your health care provider if you have ever been abused or do not feel safe at home.   This information is not intended to replace advice given to you by your health care provider. Make sure you discuss any questions you have with your health care provider.   Document Released: 12/13/2010 Document Revised: 06/20/2014 Document Reviewed: 05/01/2013 Elsevier Interactive Patient Education [employer].

## 2015-06-13 ENCOUNTER — Other Ambulatory Visit: Payer: Self-pay | Admitting: Family

## 2015-06-13 LAB — CMP14+EGFR
ALBUMIN: 4.3 g/dL (ref 3.5–5.5)
ALT: 10 IU/L (ref 0–32)
AST: 11 IU/L (ref 0–40)
Albumin/Globulin Ratio: 2.3 (ref 1.1–2.5)
Alkaline Phosphatase: 98 IU/L (ref 39–117)
BUN/Creatinine Ratio: 15 (ref 9–23)
BUN: 14 mg/dL (ref 6–24)
CALCIUM: 9.1 mg/dL (ref 8.7–10.2)
CHLORIDE: 102 mmol/L (ref 96–106)
CO2: 26 mmol/L (ref 18–29)
CREATININE: 0.92 mg/dL (ref 0.57–1.00)
GFR calc non Af Amer: 73 mL/min/{1.73_m2} (ref >59)
GFR, EST AFRICAN AMERICAN: 85 mL/min/{1.73_m2} (ref >59)
GLUCOSE: 87 mg/dL (ref 65–99)
Globulin, Total: 1.9 g/dL (ref 1.5–4.5)
Potassium: 3.8 mmol/L (ref 3.5–5.2)
Sodium: 143 mmol/L (ref 134–144)
TOTAL PROTEIN: 6.2 g/dL (ref 6.0–8.5)

## 2015-06-13 LAB — LIPID PANEL
CHOL/HDL RATIO: 3.9 ratio (ref 0.0–4.4)
Cholesterol, Total: 163 mg/dL (ref 100–199)
HDL: 42 mg/dL (ref >39)
LDL Calculated: 81 mg/dL (ref 0–99)
TRIGLYCERIDES: 199 mg/dL — AB (ref 0–149)
VLDL Cholesterol Cal: 40 mg/dL (ref 5–40)

## 2015-06-13 LAB — VITAMIN D 25 HYDROXY (VIT D DEFICIENCY, FRACTURES): Vit D, 25-Hydroxy: 22.2 ng/mL — ABNORMAL LOW (ref 30.0–100.0)

## 2015-06-13 MED ORDER — VITAMIN D (ERGOCALCIFEROL) 1.25 MG (50000 UNIT) PO CAPS: 50000.0000 [IU] | ORAL_CAPSULE | ORAL | Status: DC

## 2015-06-18 ENCOUNTER — Encounter: Payer: Self-pay | Admitting: *Deleted

## 2015-07-03 ENCOUNTER — Other Ambulatory Visit: Payer: Self-pay | Admitting: Family

## 2015-07-03 DIAGNOSIS — F988 Other specified behavioral and emotional disorders with onset usually occurring in childhood and adolescence: Secondary | ICD-10-CM

## 2015-07-03 DIAGNOSIS — K0889 Other specified disorders of teeth and supporting structures: Secondary | ICD-10-CM

## 2015-07-03 DIAGNOSIS — K047 Periapical abscess without sinus: Secondary | ICD-10-CM

## 2015-07-03 NOTE — Telephone Encounter (Signed)
She needs to plan had a little better in the future, because of the controlled nature of the sinuses this will have to go through [facility] on Monday

## 2015-07-06 ENCOUNTER — Encounter: Payer: Self-pay | Admitting: Family

## 2015-07-06 MED ORDER — HYDROCODONE-ACETAMINOPHEN 5-325 MG PO TABS: 1.0000 | ORAL_TABLET | Freq: Four times a day (QID) | ORAL | Status: DC | PRN

## 2015-07-06 MED ORDER — AMPHETAMINE-DEXTROAMPHETAMINE 20 MG PO TABS: 20.0000 mg | ORAL_TABLET | Freq: Three times a day (TID) | ORAL | Status: DC

## 2015-07-06 NOTE — Telephone Encounter (Signed)
Pt has already picked up rx's.

## 2015-07-06 NOTE — Telephone Encounter (Signed)
RX ready for pick up 

## 2015-07-23 NOTE — Therapy (Signed)
[facility] [place], [place], [address] Phone: [phone]   Fax:  [phone]  Physical Therapy Treatment  Patient Details  Name: Gail Daniels MRN: [id number] Date of Birth: 06/12/1966 No Data Recorded  Encounter Date: 03/16/2015    Past Medical History  Diagnosis Date  . Adult ADHD   . Hypertension   . GERD (gastroesophageal reflux disease)     Past Surgical History  Procedure Laterality Date  . Cesarean section    . Tubal ligation    . Ankle surgery Right     There were no vitals filed for this visit.  Visit Diagnosis:  Stiffness of ankle joint, right  Right ankle pain  Ankle fracture, right                                    PT Long Term Goals - 03/16/15 1013    PT LONG TERM GOAL #1   Title Ind with advanced HEP.   Time 12   Period Weeks   Status Achieved   PT LONG TERM GOAL #2   Time 12   Status Achieved   PT LONG TERM GOAL #3   Title Increase right ankle strength to 4+ to 5/5 to increase stability for functional tasks   Baseline unable to perform SL heel raise on right   Time 12   Period Weeks   Status On-going   PT LONG TERM GOAL #4   Title Walk a community distance with no gait deviation and pain not > 3/10.   Time 12   Period Weeks   Status On-going  Right lateral ankle pain rises to 6-7/10 with increased walking.   PT LONG TERM GOAL #5   Title Perform ADL's with pain not > 3/10.   Time 12   Period Weeks   Status On-going               Problem List Patient Active Problem List   Diagnosis Date Noted  . Depression 01/12/2015  . GAD (generalized anxiety disorder) 01/12/2015  . Current smoker 12/17/2014  . Hyperlipemia 07/03/2014  . Vitamin D deficiency 07/03/2014  . Menopause 07/02/2014  . 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 GERD 06/25/2010   PHYSICAL THERAPY DISCHARGE SUMMARY  Visits from Start of Care:  18  Current functional level related to goals / functional outcomes: Please see above.   Remaining deficits: Continued loss of right ankle strength and continued pain.   Education / Equipment: HEP.  Plan: Patient agrees to discharge.  Patient goals were partially met. Patient is being discharged due to being pleased with the current functional level.  ?????      Cordarrius Coad, [place]  MPT  07/23/2015, 2:41 PM  [facility] [address] [place], [place], [address] Phone: [phone]   Fax:  [phone]  Name: Gail Daniels MRN: [id number] Date of Birth: Aug 14, 1965

## 2015-07-29 ENCOUNTER — Other Ambulatory Visit: Payer: Self-pay | Admitting: Family

## 2015-07-29 DIAGNOSIS — K047 Periapical abscess without sinus: Secondary | ICD-10-CM

## 2015-07-29 DIAGNOSIS — F988 Other specified behavioral and emotional disorders with onset usually occurring in childhood and adolescence: Secondary | ICD-10-CM

## 2015-07-29 DIAGNOSIS — K0889 Other specified disorders of teeth and supporting structures: Secondary | ICD-10-CM

## 2015-07-30 MED ORDER — HYDROCODONE-ACETAMINOPHEN 5-325 MG PO TABS: 1.0000 | ORAL_TABLET | Freq: Four times a day (QID) | ORAL | Status: DC | PRN

## 2015-07-30 MED ORDER — AMPHETAMINE-DEXTROAMPHETAMINE 20 MG PO TABS
20.0000 mg | ORAL_TABLET | Freq: Three times a day (TID) | ORAL | Status: DC
Start: 2015-07-30 — End: 2015-09-03

## 2015-07-30 NOTE — Telephone Encounter (Signed)
Left message, scripts are ready for Adderall and Hydrocodone.

## 2015-07-30 NOTE — Telephone Encounter (Signed)
Looks to early, both was last filled 07/06/15?

## 2015-07-30 NOTE — Telephone Encounter (Signed)
RX ready for pick up 

## 2015-09-01 ENCOUNTER — Encounter: Payer: Self-pay | Admitting: Family

## 2015-09-03 ENCOUNTER — Other Ambulatory Visit: Payer: Self-pay | Admitting: Family

## 2015-09-03 DIAGNOSIS — K0889 Other specified disorders of teeth and supporting structures: Secondary | ICD-10-CM

## 2015-09-03 DIAGNOSIS — K047 Periapical abscess without sinus: Secondary | ICD-10-CM

## 2015-09-03 DIAGNOSIS — F988 Other specified behavioral and emotional disorders with onset usually occurring in childhood and adolescence: Secondary | ICD-10-CM

## 2015-09-03 MED ORDER — AMPHETAMINE-DEXTROAMPHETAMINE 20 MG PO TABS: 20.0000 mg | ORAL_TABLET | Freq: Three times a day (TID) | ORAL | Status: DC

## 2015-09-03 MED ORDER — HYDROCODONE-ACETAMINOPHEN 5-325 MG PO TABS: 1.0000 | ORAL_TABLET | Freq: Four times a day (QID) | ORAL | Status: DC | PRN

## 2015-10-01 ENCOUNTER — Other Ambulatory Visit: Payer: Self-pay | Admitting: Family

## 2015-10-01 DIAGNOSIS — F988 Other specified behavioral and emotional disorders with onset usually occurring in childhood and adolescence: Secondary | ICD-10-CM

## 2015-10-01 DIAGNOSIS — K047 Periapical abscess without sinus: Secondary | ICD-10-CM

## 2015-10-01 DIAGNOSIS — K0889 Other specified disorders of teeth and supporting structures: Secondary | ICD-10-CM

## 2015-10-01 NOTE — Telephone Encounter (Signed)
Pt needs appt

## 2015-10-02 ENCOUNTER — Telehealth: Payer: Self-pay | Admitting: Family

## 2015-10-02 MED ORDER — HYDROCODONE-ACETAMINOPHEN 5-325 MG PO TABS: 1.0000 | ORAL_TABLET | Freq: Four times a day (QID) | ORAL | Status: DC | PRN

## 2015-10-02 MED ORDER — AMPHETAMINE-DEXTROAMPHETAMINE 20 MG PO TABS: 20.0000 mg | ORAL_TABLET | Freq: Three times a day (TID) | ORAL | Status: DC

## 2015-10-02 NOTE — Addendum Note (Signed)
Addended by: Jannifer RodneyHAWKS, Amador Braddy A on: 10/02/2015 11:43 AM   Modules accepted: Orders

## 2015-10-02 NOTE — Telephone Encounter (Signed)
RX ready for pick up 

## 2015-10-02 NOTE — Telephone Encounter (Signed)
Patient aware that rx ready and per Neysa BonitoChristy if patient does not show Monday she will no longer fill medication.

## 2015-10-02 NOTE — Telephone Encounter (Signed)
[facility]

## 2015-10-02 NOTE — Telephone Encounter (Signed)
Patient aware she has to been seen before rx's can be wrote.

## 2015-10-05 ENCOUNTER — Encounter: Payer: Self-pay | Admitting: Family

## 2015-10-05 ENCOUNTER — Other Ambulatory Visit: Payer: Self-pay | Admitting: *Deleted

## 2015-10-05 ENCOUNTER — Ambulatory Visit (INDEPENDENT_AMBULATORY_CARE_PROVIDER_SITE_OTHER): Payer: Self-pay | Admitting: Family

## 2015-10-05 VITALS — BP 147/96 | HR 81 | Temp 97.0°F | Ht 65.0 in | Wt 139.0 lb

## 2015-10-05 DIAGNOSIS — F32A Depression, unspecified: Secondary | ICD-10-CM

## 2015-10-05 DIAGNOSIS — Z720 Tobacco use: Secondary | ICD-10-CM

## 2015-10-05 DIAGNOSIS — E559 Vitamin D deficiency, unspecified: Secondary | ICD-10-CM

## 2015-10-05 DIAGNOSIS — G8929 Other chronic pain: Secondary | ICD-10-CM

## 2015-10-05 DIAGNOSIS — F988 Other specified behavioral and emotional disorders with onset usually occurring in childhood and adolescence: Secondary | ICD-10-CM

## 2015-10-05 DIAGNOSIS — E785 Hyperlipidemia, unspecified: Secondary | ICD-10-CM

## 2015-10-05 DIAGNOSIS — F17200 Nicotine dependence, unspecified, uncomplicated: Secondary | ICD-10-CM

## 2015-10-05 DIAGNOSIS — F900 Attention-deficit hyperactivity disorder, predominantly inattentive type: Secondary | ICD-10-CM

## 2015-10-05 DIAGNOSIS — M25571 Pain in right ankle and joints of right foot: Secondary | ICD-10-CM

## 2015-10-05 DIAGNOSIS — F411 Generalized anxiety disorder: Secondary | ICD-10-CM

## 2015-10-05 DIAGNOSIS — K219 Gastro-esophageal reflux disease without esophagitis: Secondary | ICD-10-CM

## 2015-10-05 DIAGNOSIS — F329 Major depressive disorder, single episode, unspecified: Secondary | ICD-10-CM

## 2015-10-05 MED ORDER — AMPHETAMINE-DEXTROAMPHETAMINE 20 MG PO TABS: 20.0000 mg | ORAL_TABLET | Freq: Three times a day (TID) | ORAL | Status: DC

## 2015-10-05 MED ORDER — HYDROCODONE-ACETAMINOPHEN 5-325 MG PO TABS: 1.0000 | ORAL_TABLET | Freq: Four times a day (QID) | ORAL | Status: DC | PRN

## 2015-10-05 NOTE — Patient Instructions (Signed)
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
## 2015-10-06 ENCOUNTER — Other Ambulatory Visit: Payer: Self-pay | Admitting: Family

## 2015-10-06 ENCOUNTER — Encounter: Payer: Self-pay | Admitting: Family

## 2015-10-06 DIAGNOSIS — F988 Other specified behavioral and emotional disorders with onset usually occurring in childhood and adolescence: Secondary | ICD-10-CM

## 2015-10-06 MED ORDER — AMPHETAMINE-DEXTROAMPHETAMINE 20 MG PO TABS: 20.0000 mg | ORAL_TABLET | Freq: Three times a day (TID) | ORAL | Status: DC

## 2015-11-03 ENCOUNTER — Telehealth: Payer: Self-pay | Admitting: Family

## 2015-11-03 NOTE — Telephone Encounter (Signed)
I spoke with Ralph [facility]oyle [facility] and he states he has the rx that has do not fill till 10/05/15 on 2 rx's. This was printed the day the pt was here but the pt had sent a mychart message notifying us about the error on the rx so Jannifer RodneyChristy Hawks printed out the correct rx dated do not fill until 11/04/15 on her Adderall. The pt came by and picked it up on 10/07/15 and we have the copy of her license and and signature with the date on it. Tried calling the pt but was only able to get in touch with her mother who will let the pt know to call us back. [facility] is going to shred and discard the rx that was [place]. Pt should still have the rx for 11/04/15 in her possession.   Pt has been using multiple pharmacies to get her narcotics and controlled substances. Pt told Ralph [facility]denDoyle at [facility] she was only getting her refills and medications with them and we have 5 different pharmacies she has been using since January. After speaking with Neysa BonitoChristy she advised me to call the pharmacies to void out any rx's currently on hold and when the pt calls back for the pt to schedule an appt for pain contract and drug testing prior to any further rx's being written.

## 2015-11-11 NOTE — Telephone Encounter (Signed)
No response from patient. Note will be closed. 

## 2015-12-04 ENCOUNTER — Telehealth: Payer: Self-pay

## 2015-12-04 DIAGNOSIS — F988 Other specified behavioral and emotional disorders with onset usually occurring in childhood and adolescence: Secondary | ICD-10-CM

## 2015-12-04 MED ORDER — AMPHETAMINE-DEXTROAMPHETAMINE 20 MG PO TABS: 20.0000 mg | ORAL_TABLET | Freq: Three times a day (TID) | ORAL | Status: DC

## 2015-12-04 NOTE — Telephone Encounter (Signed)
RX ready for pick up 

## 2015-12-04 NOTE — Telephone Encounter (Signed)
FYI, patient called and had gone to her pharmacy to pick up her Adderall prescription and was told it had been cancelled.  The patient would like to know why.  Per Neysa Bonitohristy, we had received a [facility] stating that it had been filled twice by 2 different pharmacies.  The patient was told this and states that she had [facility], [facility] a [facility] and they did not have enough to fill her prescription so [facility] and had it filled there instead.  This information was given to [facility] and she will refill the medication but advise the patient she is to fill it at the same pharmacy each month and she must keep her 3 month followup appointment in July.  Patient is aware and voices understanding.

## 2015-12-28 ENCOUNTER — Ambulatory Visit (INDEPENDENT_AMBULATORY_CARE_PROVIDER_SITE_OTHER): Payer: Self-pay | Admitting: Family

## 2015-12-28 ENCOUNTER — Encounter: Payer: Self-pay | Admitting: Family

## 2015-12-28 VITALS — BP 126/89 | HR 85 | Temp 98.5°F | Ht 65.0 in | Wt 132.8 lb

## 2015-12-28 DIAGNOSIS — E785 Hyperlipidemia, unspecified: Secondary | ICD-10-CM

## 2015-12-28 DIAGNOSIS — F17200 Nicotine dependence, unspecified, uncomplicated: Secondary | ICD-10-CM

## 2015-12-28 DIAGNOSIS — F988 Other specified behavioral and emotional disorders with onset usually occurring in childhood and adolescence: Secondary | ICD-10-CM

## 2015-12-28 DIAGNOSIS — F1120 Opioid dependence, uncomplicated: Secondary | ICD-10-CM

## 2015-12-28 DIAGNOSIS — F411 Generalized anxiety disorder: Secondary | ICD-10-CM

## 2015-12-28 DIAGNOSIS — F900 Attention-deficit hyperactivity disorder, predominantly inattentive type: Secondary | ICD-10-CM

## 2015-12-28 DIAGNOSIS — M25571 Pain in right ankle and joints of right foot: Secondary | ICD-10-CM

## 2015-12-28 DIAGNOSIS — E559 Vitamin D deficiency, unspecified: Secondary | ICD-10-CM

## 2015-12-28 DIAGNOSIS — G8929 Other chronic pain: Secondary | ICD-10-CM

## 2015-12-28 DIAGNOSIS — F32A Depression, unspecified: Secondary | ICD-10-CM

## 2015-12-28 DIAGNOSIS — K219 Gastro-esophageal reflux disease without esophagitis: Secondary | ICD-10-CM

## 2015-12-28 DIAGNOSIS — F329 Major depressive disorder, single episode, unspecified: Secondary | ICD-10-CM

## 2015-12-28 DIAGNOSIS — Z720 Tobacco use: Secondary | ICD-10-CM

## 2015-12-28 MED ORDER — SIMVASTATIN 20 MG PO TABS: 20.0000 mg | ORAL_TABLET | Freq: Every day | ORAL | Status: AC

## 2015-12-28 MED ORDER — HYDROCODONE-ACETAMINOPHEN 5-325 MG PO TABS: 1.0000 | ORAL_TABLET | Freq: Four times a day (QID) | ORAL | Status: DC | PRN

## 2015-12-28 MED ORDER — AMPHETAMINE-DEXTROAMPHETAMINE 20 MG PO TABS: 20.0000 mg | ORAL_TABLET | Freq: Three times a day (TID) | ORAL | Status: DC

## 2015-12-28 NOTE — Patient Instructions (Signed)
Health Maintenance, Female Adopting a healthy lifestyle and getting preventive care can go a long way to promote health and wellness. Talk with your health care provider about what schedule of regular examinations is right for you. This is a good chance for you to check in with your provider about disease prevention and staying healthy. In between checkups, there are plenty of things you can do on your own. Experts have done a lot of research about which lifestyle changes and preventive measures are most likely to keep you healthy. Ask your health care provider for more information. WEIGHT AND DIET  Eat a healthy diet  Be sure to include plenty of vegetables, fruits, low-fat dairy products, and lean protein.  Do not eat a lot of foods high in solid fats, added sugars, or salt.  Get regular exercise. This is one of the most important things you can do for your health.  Most adults should exercise for at least 150 minutes each week. The exercise should increase your heart rate and make you sweat (moderate-intensity exercise).  Most adults should also do strengthening exercises at least twice a week. This is in addition to the moderate-intensity exercise.  Maintain a healthy weight  Body mass index (BMI) is a measurement that can be used to identify possible weight problems. It estimates body fat based on height and weight. Your health care provider can help determine your BMI and help you achieve or maintain a healthy weight.  For females 20 years of age and older:   A BMI below 18.5 is considered underweight.  A BMI of 18.5 to 24.9 is normal.  A BMI of 25 to 29.9 is considered overweight.  A BMI of 30 and above is considered obese.  Watch levels of cholesterol and blood lipids  You should start having your blood tested for lipids and cholesterol at 50 years of age, then have this test every 5 years.  You may need to have your cholesterol levels checked more often if:  Your lipid  or cholesterol levels are high.  You are older than 50 years of age.  You are at high risk for heart disease.  CANCER SCREENING   Lung Cancer  Lung cancer screening is recommended for adults 55-80 years old who are at high risk for lung cancer because of a history of smoking.  A yearly low-dose CT scan of the lungs is recommended for people who:  Currently smoke.  Have quit within the past 15 years.  Have at least a 30-pack-year history of smoking. A pack year is smoking an average of one pack of cigarettes a day for 1 year.  Yearly screening should continue until it has been 15 years since you quit.  Yearly screening should stop if you develop a health problem that would prevent you from having lung cancer treatment.  Breast Cancer  Practice breast self-awareness. This means understanding how your breasts normally appear and feel.  It also means doing regular breast self-exams. Let your health care provider know about any changes, no matter how small.  If you are in your 20s or 30s, you should have a clinical breast exam (CBE) by a health care provider every 1-3 years as part of a regular health exam.  If you are 40 or older, have a CBE every year. Also consider having a breast X-ray (mammogram) every year.  If you have a family history of breast cancer, talk to your health care provider about genetic screening.  If you   are at high risk for breast cancer, talk to your health care provider about having an MRI and a mammogram every year.  Breast cancer gene (BRCA) assessment is recommended for women who have family members with BRCA-related cancers. BRCA-related cancers include:  Breast.  Ovarian.  Tubal.  Peritoneal cancers.  Results of the assessment will determine the need for genetic counseling and BRCA1 and BRCA2 testing. Cervical Cancer Your health care provider may recommend that you be screened regularly for cancer of the pelvic organs (ovaries, uterus, and  vagina). This screening involves a pelvic examination, including checking for microscopic changes to the surface of your cervix (Pap test). You may be encouraged to have this screening done every 3 years, beginning at age 21.  For women ages 30-65, health care providers may recommend pelvic exams and Pap testing every 3 years, or they may recommend the Pap and pelvic exam, combined with testing for human papilloma virus (HPV), every 5 years. Some types of HPV increase your risk of cervical cancer. Testing for HPV may also be done on women of any age with unclear Pap test results.  Other health care providers may not recommend any screening for nonpregnant women who are considered low risk for pelvic cancer and who do not have symptoms. Ask your health care provider if a screening pelvic exam is right for you.  If you have had past treatment for cervical cancer or a condition that could lead to cancer, you need Pap tests and screening for cancer for at least 20 years after your treatment. If Pap tests have been discontinued, your risk factors (such as having a new sexual partner) need to be reassessed to determine if screening should resume. Some women have medical problems that increase the chance of getting cervical cancer. In these cases, your health care provider may recommend more frequent screening and Pap tests. Colorectal Cancer  This type of cancer can be detected and often prevented.  Routine colorectal cancer screening usually begins at 50 years of age and continues through 50 years of age.  Your health care provider may recommend screening at an earlier age if you have risk factors for colon cancer.  Your health care provider may also recommend using home test kits to check for hidden blood in the stool.  A small camera at the end of a tube can be used to examine your colon directly (sigmoidoscopy or colonoscopy). This is done to check for the earliest forms of colorectal  cancer.  Routine screening usually begins at age 50.  Direct examination of the colon should be repeated every 5-10 years through 50 years of age. However, you may need to be screened more often if early forms of precancerous polyps or small growths are found. Skin Cancer  Check your skin from head to toe regularly.  Tell your health care provider about any new moles or changes in moles, especially if there is a change in a mole's shape or color.  Also tell your health care provider if you have a mole that is larger than the size of a pencil eraser.  Always use sunscreen. Apply sunscreen liberally and repeatedly throughout the day.  Protect yourself by wearing long sleeves, pants, a wide-brimmed hat, and sunglasses whenever you are outside. HEART DISEASE, DIABETES, AND HIGH BLOOD PRESSURE   High blood pressure causes heart disease and increases the risk of stroke. High blood pressure is more likely to develop in:  People who have blood pressure in the high end   of the normal range (130-139/85-89 mm Hg).  People who are overweight or obese.  People who are African American.  If you are 38-23 years of age, have your blood pressure checked every 3-5 years. If you are 61 years of age or older, have your blood pressure checked every year. You should have your blood pressure measured twice--once when you are at a hospital or clinic, and once when you are not at a hospital or clinic. Record the average of the two measurements. To check your blood pressure when you are not at a hospital or clinic, you can use:  An automated blood pressure machine at a pharmacy.  A home blood pressure monitor.  If you are between 45 years and 39 years old, ask your health care provider if you should take aspirin to prevent strokes.  Have regular diabetes screenings. This involves taking a blood sample to check your fasting blood sugar level.  If you are at a normal weight and have a low risk for diabetes,  have this test once every three years after 50 years of age.  If you are overweight and have a high risk for diabetes, consider being tested at a younger age or more often. PREVENTING INFECTION  Hepatitis B  If you have a higher risk for hepatitis B, you should be screened for this virus. You are considered at high risk for hepatitis B if:  You were born in a country where hepatitis B is common. Ask your health care provider which countries are considered high risk.  Your parents were born in a high-risk country, and you have not been immunized against hepatitis B (hepatitis B vaccine).  You have HIV or AIDS.  You use needles to inject street drugs.  You live with someone who has hepatitis B.  You have had sex with someone who has hepatitis B.  You get hemodialysis treatment.  You take certain medicines for conditions, including cancer, organ transplantation, and autoimmune conditions. Hepatitis C  Blood testing is recommended for:  Everyone born from [birth year] through 1965.  Anyone with known risk factors for hepatitis C. Sexually transmitted infections (STIs)  You should be screened for sexually transmitted infections (STIs) including gonorrhea and chlamydia if:  You are sexually active and are younger than 50 years of age.  You are older than 50 years of age and your health care provider tells you that you are at risk for this type of infection.  Your sexual activity has changed since you were last screened and you are at an increased risk for chlamydia or gonorrhea. Ask your health care provider if you are at risk.  If you do not have HIV, but are at risk, it may be recommended that you take a prescription medicine daily to prevent HIV infection. This is called pre-exposure prophylaxis (PrEP). You are considered at risk if:  You are sexually active and do not regularly use condoms or know the HIV status of your partner(s).  You take drugs by injection.  You are sexually  active with a partner who has HIV. Talk with your health care provider about whether you are at high risk of being infected with HIV. If you choose to begin PrEP, you should first be tested for HIV. You should then be tested every 3 months for as long as you are taking PrEP.  PREGNANCY   If you are premenopausal and you may become pregnant, ask your health care provider about preconception counseling.  If you may  become pregnant, take 400 to 800 micrograms (mcg) of folic acid every day.  If you want to prevent pregnancy, talk to your health care provider about birth control (contraception). OSTEOPOROSIS AND MENOPAUSE   Osteoporosis is a disease in which the bones lose minerals and strength with aging. This can result in serious bone fractures. Your risk for osteoporosis can be identified using a bone density scan.  If you are 61 years of age or older, or if you are at risk for osteoporosis and fractures, ask your health care provider if you should be screened.  Ask your health care provider whether you should take a calcium or vitamin D supplement to lower your risk for osteoporosis.  Menopause may have certain physical symptoms and risks.  Hormone replacement therapy may reduce some of these symptoms and risks. Talk to your health care provider about whether hormone replacement therapy is right for you.  HOME CARE INSTRUCTIONS   Schedule regular health, dental, and eye exams.  Stay current with your immunizations.   Do not use any tobacco products including cigarettes, chewing tobacco, or electronic cigarettes.  If you are pregnant, do not drink alcohol.  If you are breastfeeding, limit how much and how often you drink alcohol.  Limit alcohol intake to no more than 1 drink per day for nonpregnant women. One drink equals 12 ounces of beer, 5 ounces of wine, or 1 ounces of hard liquor.  Do not use street drugs.  Do not share needles.  Ask your health care provider for help if  you need support or information about quitting drugs.  Tell your health care provider if you often feel depressed.  Tell your health care provider if you have ever been abused or do not feel safe at home.   This information is not intended to replace advice given to you by your health care provider. Make sure you discuss any questions you have with your health care provider.   Document Released: 12/13/2010 Document Revised: 06/20/2014 Document Reviewed: 05/01/2013 Elsevier Interactive Patient Education [employer].
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
## 2016-01-05 LAB — TOXASSURE SELECT 13 (MW), URINE: PDF: 0

## 2016-01-28 ENCOUNTER — Telehealth: Payer: Self-pay | Admitting: Family

## 2016-01-28 NOTE — Telephone Encounter (Signed)
Patient aware to come by for a repeat urine drug screen.  Provider will decide about pain script after urine resulted.

## 2016-01-28 NOTE — Telephone Encounter (Signed)
Pt needs to repeat drug screen. No Hydrocodone in urine last time. After drug screen pt can have script.

## 2016-03-24 ENCOUNTER — Encounter: Payer: Self-pay | Admitting: Family

## 2016-03-29 ENCOUNTER — Ambulatory Visit (INDEPENDENT_AMBULATORY_CARE_PROVIDER_SITE_OTHER): Payer: Self-pay | Admitting: Family

## 2016-03-29 ENCOUNTER — Encounter: Payer: Self-pay | Admitting: Family

## 2016-03-29 VITALS — BP 138/94 | HR 86 | Temp 98.4°F | Ht 65.0 in | Wt 132.0 lb

## 2016-03-29 DIAGNOSIS — F1120 Opioid dependence, uncomplicated: Secondary | ICD-10-CM

## 2016-03-29 DIAGNOSIS — F988 Other specified behavioral and emotional disorders with onset usually occurring in childhood and adolescence: Secondary | ICD-10-CM

## 2016-03-29 DIAGNOSIS — M25571 Pain in right ankle and joints of right foot: Secondary | ICD-10-CM | POA: Insufficient documentation

## 2016-03-29 DIAGNOSIS — G8929 Other chronic pain: Secondary | ICD-10-CM

## 2016-03-29 DIAGNOSIS — F1520 Other stimulant dependence, uncomplicated: Secondary | ICD-10-CM

## 2016-03-29 DIAGNOSIS — Z79899 Other long term (current) drug therapy: Secondary | ICD-10-CM | POA: Insufficient documentation

## 2016-03-29 MED ORDER — AMPHETAMINE-DEXTROAMPHETAMINE 20 MG PO TABS: 20.0000 mg | ORAL_TABLET | Freq: Three times a day (TID) | ORAL | 0 refills | Status: AC

## 2016-03-29 MED ORDER — HYDROCODONE-ACETAMINOPHEN 5-325 MG PO TABS: 1.0000 | ORAL_TABLET | Freq: Two times a day (BID) | ORAL | 0 refills | Status: AC | PRN

## 2016-03-29 MED ORDER — HYDROCODONE-ACETAMINOPHEN 5-325 MG PO TABS
1.0000 | ORAL_TABLET | Freq: Two times a day (BID) | ORAL | 0 refills | Status: AC | PRN
Start: 2016-03-29 — End: ?

## 2016-03-29 NOTE — Patient Instructions (Signed)

## 2016-03-29 NOTE — Progress Notes (Signed)
[place] Controlled Substance Abuse database reviewed- Yes If yes- were their any concerning findings : Pt has only received medication from me [facility] 03/29/2016 12/28/2015 10/05/2015 06/12/2015 02/23/2015  Decreased Interest 0 0 0 0 0  Down, Depressed, Hopeless 0 0 0 0 0  PHQ - 2 Score 0 0 0 0 0  Altered sleeping - - - - -  Tired, decreased energy - - - - -  Change in appetite - - - - -  Feeling bad or failure about yourself  - - - - -  Trouble concentrating - - - - -  Moving slowly or fidgety/restless - - - - -  Suicidal thoughts - - - - -  PHQ-9 Score - - - - -    GAD 7 : Generalized Anxiety Score 12/28/2015 10/05/2015 10/05/2015  Nervous, Anxious, on Edge 1 - 1  Control/stop worrying 0 - 0  Worry too much - different things 0 - 0  Trouble relaxing 0 - 0  Restless 0 - 1  Easily annoyed or irritable 1 0 0  Afraid - awful might happen 0 0 -  Total GAD 7 Score 2 - -  Anxiety Difficulty Not difficult at all - -       [facility] drug screen performed- Yes  SOAPP  0= never  1= seldom  2=sometimes  3= often  4= very often  How often do you have mood swings? 1 How often do you smoke a cigarette within an hour after waling up? 4 How often have you taken medication other than the way that it was prescribed?0 How often have you used illegal drugs in the past 5 years? 0 How often, in your lifetime, have you had legal problems or been arrested? 3  Score 8  Alcohol Audit - How often during the last year have found that you: 0-Never   1- Less than monthly   2- Monthly     3-Weekly     4-daily or almost daily  - found that you were not able to stop drinking once you started- 0 -failed to do what was normally expected of you because of drinking- 0 -needed a first drink in the morning- 0 -had a feeling of guilt or remorse after drinking- 0 -are/were unable to remember what happened the night before because of your drinking- 0  0- NO   2- yes but not in last  year  4- yes during last year -Have you or someone else been injured because of your drinking- 0 - Has anyone been concerned about your drinking or suggested you cut down- 0        TOTAL- 0  ( 0-7- alcohol education, 8-15- simple advice, 16-19 simple advice plus counseling, 20-40 referral for evaluation and treatment 0   [facility]   Pain assessment: Cause of pain- Chronic right ankle pain Pain location- Right ankle Pain on scale of 1-10- 7 Frequency- constant What increases pain-Walking What makes pain Better-Nothing  Prior treatments tried and failed- Soaking in warm water, Voltaren gel  Current treatments- Norco Morphine mg equivalent- 5 mg  Pain management agreement reviewed and signed- Yes

## 2016-04-04 ENCOUNTER — Other Ambulatory Visit: Payer: Self-pay | Admitting: Family

## 2016-04-04 LAB — TOXASSURE SELECT 13 (MW), URINE

## 2016-04-06 ENCOUNTER — Other Ambulatory Visit: Payer: Self-pay | Admitting: Family

## 2016-04-06 DIAGNOSIS — Z9114 Patient's other noncompliance with medication regimen: Secondary | ICD-10-CM

## 2016-04-06 DIAGNOSIS — G8929 Other chronic pain: Secondary | ICD-10-CM

## 2016-04-06 DIAGNOSIS — M25571 Pain in right ankle and joints of right foot: Principal | ICD-10-CM

## 2016-04-06 DIAGNOSIS — F1410 Cocaine abuse, uncomplicated: Secondary | ICD-10-CM

## 2016-04-09 ENCOUNTER — Telehealth: Payer: Self-pay | Admitting: *Deleted

## 2016-04-09 NOTE — Telephone Encounter (Signed)
Aderrall and Norco canceled

## 2016-04-09 NOTE — Telephone Encounter (Signed)
-----   Message from Junie Spencerhristy A Hawks, FNP sent at 04/08/2016  5:08 PM EDT ----- Please call pharmacy and cancel adderall and Norco rx. Thanks

## 2016-05-24 ENCOUNTER — Telehealth: Payer: Self-pay | Admitting: Family

## 2016-05-24 NOTE — Telephone Encounter (Signed)
[facility] no [facility] will be refilled from our office Canceled all remaining RXS

## 2016-05-27 ENCOUNTER — Telehealth: Payer: Self-pay | Admitting: Family

## 2016-05-27 NOTE — Telephone Encounter (Signed)
Pt is dismissed - Neysa BonitoChristy do you know about this

## 2016-05-28 ENCOUNTER — Encounter: Payer: Self-pay | Admitting: Family

## 2016-05-30 ENCOUNTER — Other Ambulatory Visit: Payer: Self-pay | Admitting: Family

## 2016-05-30 NOTE — Telephone Encounter (Signed)
Pt had positive drug screen. Can not refill medication

## 2019-03-01 ENCOUNTER — Other Ambulatory Visit: Payer: Self-pay | Admitting: Internal Medicine

## 2019-03-01 DIAGNOSIS — Z1231 Encounter for screening mammogram for malignant neoplasm of breast: Secondary | ICD-10-CM

## 2019-04-16 ENCOUNTER — Ambulatory Visit: Payer: Self-pay

## 2019-05-27 ENCOUNTER — Other Ambulatory Visit: Payer: Self-pay

## 2019-05-27 ENCOUNTER — Ambulatory Visit
Admission: RE | Admit: 2019-05-27 | Discharge: 2019-05-27 | Disposition: A | Discharge status: Home / Self Care | Payer: 59 | Source: Ambulatory Visit | Attending: Internal Medicine | Admitting: Internal Medicine

## 2019-05-27 DIAGNOSIS — Z1231 Encounter for screening mammogram for malignant neoplasm of breast: Secondary | ICD-10-CM

## 2020-03-26 DIAGNOSIS — E785 Hyperlipidemia, unspecified: Secondary | ICD-10-CM | POA: Diagnosis not present

## 2020-03-26 DIAGNOSIS — R7301 Impaired fasting glucose: Secondary | ICD-10-CM | POA: Diagnosis not present

## 2020-03-26 DIAGNOSIS — Z0000 Encounter for general adult medical examination without abnormal findings: Secondary | ICD-10-CM | POA: Diagnosis not present

## 2020-04-02 DIAGNOSIS — E785 Hyperlipidemia, unspecified: Secondary | ICD-10-CM | POA: Diagnosis not present

## 2020-04-02 DIAGNOSIS — Z1389 Encounter for screening for other disorder: Secondary | ICD-10-CM | POA: Diagnosis not present

## 2020-04-02 DIAGNOSIS — Z0000 Encounter for general adult medical examination without abnormal findings: Secondary | ICD-10-CM | POA: Diagnosis not present

## 2020-04-02 DIAGNOSIS — Z1331 Encounter for screening for depression: Secondary | ICD-10-CM | POA: Diagnosis not present

## 2020-04-10 ENCOUNTER — Other Ambulatory Visit: Payer: Self-pay | Admitting: Internal Medicine

## 2020-07-15 DIAGNOSIS — R8279 Other abnormal findings on microbiological examination of urine: Secondary | ICD-10-CM | POA: Diagnosis not present

## 2020-07-15 DIAGNOSIS — Z01419 Encounter for gynecological examination (general) (routine) without abnormal findings: Secondary | ICD-10-CM | POA: Diagnosis not present

## 2020-07-15 DIAGNOSIS — A599 Trichomoniasis, unspecified: Secondary | ICD-10-CM | POA: Diagnosis not present

## 2020-07-15 DIAGNOSIS — Z6825 Body mass index (BMI) 25.0-25.9, adult: Secondary | ICD-10-CM | POA: Diagnosis not present

## 2021-12-16 DIAGNOSIS — Z0000 Encounter for general adult medical examination without abnormal findings: Secondary | ICD-10-CM | POA: Diagnosis not present

## 2021-12-16 DIAGNOSIS — Z1331 Encounter for screening for depression: Secondary | ICD-10-CM | POA: Diagnosis not present

## 2021-12-16 DIAGNOSIS — Z1339 Encounter for screening examination for other mental health and behavioral disorders: Secondary | ICD-10-CM | POA: Diagnosis not present

## 2021-12-16 DIAGNOSIS — E785 Hyperlipidemia, unspecified: Secondary | ICD-10-CM | POA: Diagnosis not present

## 2021-12-17 ENCOUNTER — Other Ambulatory Visit: Payer: Self-pay | Admitting: Internal Medicine

## 2021-12-17 DIAGNOSIS — Z1211 Encounter for screening for malignant neoplasm of colon: Secondary | ICD-10-CM

## 2021-12-17 DIAGNOSIS — F17210 Nicotine dependence, cigarettes, uncomplicated: Secondary | ICD-10-CM

## 2022-01-24 ENCOUNTER — Ambulatory Visit
Admission: RE | Admit: 2022-01-24 | Discharge: 2022-01-24 | Disposition: A | Discharge status: Home / Self Care | Payer: BC Managed Care – PPO | Source: Ambulatory Visit | Attending: Internal Medicine | Admitting: Internal Medicine

## 2022-01-24 DIAGNOSIS — F17210 Nicotine dependence, cigarettes, uncomplicated: Secondary | ICD-10-CM

## 2022-02-10 DIAGNOSIS — Z1231 Encounter for screening mammogram for malignant neoplasm of breast: Secondary | ICD-10-CM | POA: Diagnosis not present

## 2023-01-12 DIAGNOSIS — I700 Atherosclerosis of aorta: Secondary | ICD-10-CM | POA: Diagnosis not present

## 2023-01-12 DIAGNOSIS — Z1331 Encounter for screening for depression: Secondary | ICD-10-CM | POA: Diagnosis not present

## 2023-01-12 DIAGNOSIS — E785 Hyperlipidemia, unspecified: Secondary | ICD-10-CM | POA: Diagnosis not present

## 2023-01-12 DIAGNOSIS — R82998 Other abnormal findings in urine: Secondary | ICD-10-CM | POA: Diagnosis not present

## 2023-01-12 DIAGNOSIS — R7301 Impaired fasting glucose: Secondary | ICD-10-CM | POA: Diagnosis not present

## 2023-01-12 DIAGNOSIS — E559 Vitamin D deficiency, unspecified: Secondary | ICD-10-CM | POA: Diagnosis not present

## 2023-01-12 DIAGNOSIS — Z0000 Encounter for general adult medical examination without abnormal findings: Secondary | ICD-10-CM | POA: Diagnosis not present

## 2023-01-12 DIAGNOSIS — Z1339 Encounter for screening examination for other mental health and behavioral disorders: Secondary | ICD-10-CM | POA: Diagnosis not present

## 2023-02-07 ENCOUNTER — Encounter: Payer: Self-pay | Admitting: Internal Medicine

## 2023-02-07 DIAGNOSIS — R92323 Mammographic fibroglandular density, bilateral breasts: Secondary | ICD-10-CM | POA: Diagnosis not present

## 2023-02-07 DIAGNOSIS — Z1231 Encounter for screening mammogram for malignant neoplasm of breast: Secondary | ICD-10-CM | POA: Diagnosis not present

## 2023-03-17 ENCOUNTER — Ambulatory Visit (AMBULATORY_SURGERY_CENTER): Payer: BC Managed Care – PPO | Admitting: *Deleted

## 2023-03-17 VITALS — Ht 64.0 in | Wt 140.0 lb

## 2023-03-17 DIAGNOSIS — Z1211 Encounter for screening for malignant neoplasm of colon: Secondary | ICD-10-CM

## 2023-03-17 MED ORDER — NA SULFATE-K SULFATE-MG SULF 17.5-3.13-1.6 GM/177ML PO SOLN: 1.0000 | Freq: Once | ORAL | 0 refills | Status: AC

## 2023-03-17 NOTE — Progress Notes (Signed)
Pt's name and DOB verified at the beginning of the pre-visit wit 2 identifiers  Pt denies any difficulty with ambulating,sitting, laying down or rolling side to side  Gave both [facility] main # and MD on call # prior to instructions.   No egg or soy allergy known to patient   No issues known to pt with past sedation with any surgeries or procedures  Pt denies having issues being intubated  Pt has no issues moving head neck or swallowing  No FH of Malignant Hyperthermia  Pt is not on diet pills or shots  Pt is not on home 02   Pt is not on blood thinners   Pt has frequent issues with constipation RN instructed pt to use Miralax per bottles instructions a week before prep days. Pt states they will  Pt is not on dialysis  Pt denise any abnormal heart rhythms   Pt denies any upcoming cardiac testing  Pt encouraged to use to use Singlecare or Goodrx to reduce cost   Patient's chart reviewed by Cathlyn Parsons CNRA prior to pre-visit and patient appropriate for the [facility].  Pre-visit completed and red dot placed by patient's name on their procedure day (on provider's schedule).  .  Visit by phone  Pt states weight is 140 lb  Instructed pt why it is important to and  to call if they have any changes in health or new medications. Directed them to the # given and on instructions.     Instructions reviewed with pt and pt states understanding. Instructed to review again prior to procedure. Pt states they will.   Instructions sent by mail with coupon and by my chart

## 2023-03-22 ENCOUNTER — Encounter: Payer: Self-pay | Admitting: Internal Medicine

## 2023-04-02 ENCOUNTER — Telehealth: Payer: Self-pay | Admitting: Gastroenterology

## 2023-04-02 MED ORDER — NA SULFATE-K SULFATE-MG SULF 17.5-3.13-1.6 GM/177ML PO SOLN
1.0000 | Freq: Once | ORAL | 0 refills | Status: AC
Start: 2023-04-02 — End: 2023-04-02

## 2023-04-02 NOTE — Telephone Encounter (Signed)
Received a page from the on-call service that patient needed her colonoscopy prep called into the [facility] in [place].  Colonoscopy not scheduled until 10/24.  Does not look like it was sent in during her preop visit.  Prescription for Suprep was sent to [facility] in [place].  Patient was informed that it was send to the above pharmacy.

## 2023-04-03 MED ORDER — NA SULFATE-K SULFATE-MG SULF 17.5-3.13-1.6 GM/177ML PO SOLN
1.0000 | Freq: Once | ORAL | 0 refills | Status: AC
Start: 2023-04-03 — End: 2023-04-03

## 2023-04-03 NOTE — Telephone Encounter (Signed)
RX prep sent to pharmacy in [place] and pt called and informed via VM that it is in the pharmacy computer.

## 2023-04-06 ENCOUNTER — Ambulatory Visit: Payer: BC Managed Care – PPO | Admitting: Internal Medicine

## 2023-04-06 ENCOUNTER — Encounter: Payer: Self-pay | Admitting: Internal Medicine

## 2023-04-06 VITALS — BP 132/72 | HR 73 | Temp 97.9°F | Resp 15 | Ht 64.0 in | Wt 140.0 lb

## 2023-04-06 DIAGNOSIS — D123 Benign neoplasm of transverse colon: Secondary | ICD-10-CM | POA: Diagnosis not present

## 2023-04-06 DIAGNOSIS — Z1211 Encounter for screening for malignant neoplasm of colon: Secondary | ICD-10-CM | POA: Diagnosis not present

## 2023-04-06 DIAGNOSIS — K639 Disease of intestine, unspecified: Secondary | ICD-10-CM | POA: Diagnosis not present

## 2023-04-06 MED ORDER — SODIUM CHLORIDE 0.9 % IV SOLN: 500.0000 mL | INTRAVENOUS | Status: DC

## 2023-04-06 NOTE — Progress Notes (Signed)
Sedate, gd SR, tolerated procedure well, VSS, report to RN 

## 2023-04-06 NOTE — Progress Notes (Signed)
HISTORY OF PRESENT ILLNESS:  Gail Daniels is a 57 y.o. female who presents today for routine screening colonoscopy.  No complaints  REVIEW OF SYSTEMS:  All non-GI ROS negative except for  Past Medical History:  Diagnosis Date   Adult ADHD    GERD (gastroesophageal reflux disease)    Hyperlipidemia    Hypertension    Substance abuse (HCC)     Past Surgical History:  Procedure Laterality Date   ANKLE SURGERY Right    BREAST BIOPSY Right    pt doesnt remember   BREAST EXCISIONAL BIOPSY Left 1998   [place]   CESAREAN SECTION     TUBAL LIGATION      Social History Allanah Woolbright  reports that she has been smoking cigarettes. She has a 30 pack-year smoking history. She does not have any smokeless tobacco history on file. She reports that she does not currently use alcohol. She reports that she does not use drugs.  family history includes Asthma in her brother; Cancer in her father and mother; Diabetes in her maternal grandmother; Hyperlipidemia in her father; Kidney disease in her maternal grandmother.  Allergies  Allergen Reactions   Codeine Nausea And Vomiting   Dilaudid [Hydromorphone Hcl] Other (See Comments)    vomiting       PHYSICAL EXAMINATION: Vital signs: BP (!) 153/93   Pulse 71   Temp 97.9 F (36.6 C)   Ht 5\' 4"  (1.626 m)   Wt 140 lb (63.5 kg)   SpO2 98%   BMI 24.03 kg/m  General: Well-developed, well-nourished, no acute distress HEENT: Sclerae are anicteric, conjunctiva pink. Oral mucosa intact Lungs: Clear Heart: Regular Abdomen: soft, nontender, nondistended, no obvious ascites, no peritoneal signs, normal bowel sounds. No organomegaly. Extremities: No edema Psychiatric: alert and oriented x3. Cooperative     ASSESSMENT:  Colon cancer screening   PLAN:   Screening colonoscopy

## 2023-04-06 NOTE — Progress Notes (Signed)
Pt's states no medical or surgical changes since previsit or office visit. 

## 2023-04-06 NOTE — Progress Notes (Signed)
Called to room to assist during endoscopic procedure.  Patient ID and intended procedure confirmed with present staff. Received instructions for my participation in the procedure from the performing physician.  

## 2023-04-06 NOTE — Patient Instructions (Signed)
Patient given educational handouts related to procedure. - Resume previous diet - Continue present medications. - Await pathology results   YOU HAD AN ENDOSCOPIC PROCEDURE TODAY AT THE [facility]:   Refer to the procedure report that was given to you for any specific questions about what was found during the examination.  If the procedure report does not answer your questions, please call your gastroenterologist to clarify.  If you requested that your care partner not be given the details of your procedure findings, then the procedure report has been included in a sealed envelope for you to review at your convenience later.  YOU SHOULD EXPECT: Some feelings of bloating in the abdomen. Passage of more gas than usual.  Walking can help get rid of the air that was put into your GI tract during the procedure and reduce the bloating. If you had a lower endoscopy (such as a colonoscopy or flexible sigmoidoscopy) you may notice spotting of blood in your stool or on the toilet paper. If you underwent a bowel prep for your procedure, you may not have a normal bowel movement for a few days.  Please Note:  You might notice some irritation and congestion in your nose or some drainage.  This is from the oxygen used during your procedure.  There is no need for concern and it should clear up in a day or so.  SYMPTOMS TO REPORT IMMEDIATELY:  Following lower endoscopy (colonoscopy or flexible sigmoidoscopy):  Excessive amounts of blood in the stool  Significant tenderness or worsening of abdominal pains  Swelling of the abdomen that is new, acute  Fever of 100F or higher  For urgent or emergent issues, a gastroenterologist can be reached at any hour by calling [phone]. Do not use MyChart messaging for urgent concerns.    DIET:  We do recommend a small meal at first, but then you may proceed to your regular diet.  Drink plenty of fluids but you should avoid alcoholic beverages for 24  hours.  ACTIVITY:  You should plan to take it easy for the rest of today and you should NOT DRIVE or use heavy machinery until tomorrow (because of the sedation medicines used during the test).    FOLLOW UP: Our staff will call the number listed on your records the next business day following your procedure.  We will call around 7:15- 8:00 am to check on you and address any questions or concerns that you may have regarding the information given to you following your procedure. If we do not reach you, we will leave a message.     If any biopsies were taken you will be contacted by phone or by letter within the next 1-3 weeks.  Please call us at [phone] if you have not heard about the biopsies in 3 weeks.    SIGNATURES/CONFIDENTIALITY: You and/or your care partner have signed paperwork which will be entered into your electronic medical record.  These signatures attest to the fact that that the information above on your After Visit Summary has been reviewed and is understood.  Full responsibility of the confidentiality of this discharge information lies with you and/or your care-partner.

## 2023-04-06 NOTE — Op Note (Signed)
[facility] Patient Name: Gail Daniels Procedure Date: 04/06/2023 11:08 AM MRN: [id number] Endoscopist: Wilhemina Bonito. Marina Goodell , MD, [id number] Age: 57 Referring MD:  Date of Birth: Nov 03, 1965 Gender: Female Account #: [id number] Procedure:                Colonoscopy with cold snare polypectomy x 1 Indications:              Screening for colorectal malignant neoplasm Medicines:                Monitored Anesthesia Care Procedure:                Pre-Anesthesia Assessment:                           - Prior to the procedure, a History and Physical                            was performed, and patient medications and                            allergies were reviewed. The patient's tolerance of                            previous anesthesia was also reviewed. The risks                            and benefits of the procedure and the sedation                            options and risks were discussed with the patient.                            All questions were answered, and informed consent                            was obtained. Prior Anticoagulants: The patient has                            taken no anticoagulant or antiplatelet agents. ASA                            Grade Assessment: II - A patient with mild systemic                            disease. After reviewing the risks and benefits,                            the patient was deemed in satisfactory condition to                            undergo the procedure.                           After obtaining informed consent, the colonoscope  was passed under direct vision. Throughout the                            procedure, the patient's blood pressure, pulse, and                            oxygen saturations were monitored continuously. The                            CF [id number] #[id number] was introduced through the anus                            and advanced to the the cecum, identified by                             appendiceal orifice and ileocecal valve. The                            ileocecal valve, appendiceal orifice, and rectum                            were photographed. The quality of the bowel                            preparation was good. The colonoscopy was performed                            without difficulty. The patient tolerated the                            procedure well. The bowel preparation used was                            SUPREP via split dose instruction. Scope In: 11:18:41 AM Scope Out: 11:34:23 AM Scope Withdrawal Time: 0 hours 8 minutes 8 seconds  Total Procedure Duration: 0 hours 15 minutes 42 seconds  Findings:                 A 5 mm polyp was found in the proximal transverse                            colon. The polyp was removed with a cold snare.                            Resection and retrieval were complete.                           Multiple diverticula were found in the sigmoid                            colon.                           Internal hemorrhoids were found during retroflexion.  The exam was otherwise without abnormality on                            direct and retroflexion views. Complications:            No immediate complications. Estimated blood loss:                            None. Estimated Blood Loss:     Estimated blood loss: none. Impression:               - One 5 mm polyp in the proximal transverse colon,                            removed with a cold snare. Resected and retrieved.                           - Diverticulosis in the sigmoid colon.                           - Internal hemorrhoids.                           - The examination was otherwise normal on direct                            and retroflexion views. Recommendation:           - Repeat colonoscopy in 7-10 years for surveillance.                           - Patient has a contact number available for                             emergencies. The signs and symptoms of potential                            delayed complications were discussed with the                            patient. Return to normal activities tomorrow.                            Written discharge instructions were provided to the                            patient.                           - Resume previous diet.                           - Continue present medications.                           - Await pathology results. Wilhemina Bonito. Marina Goodell, MD 04/06/2023 11:40:19 AM This report has been signed electronically.

## 2023-04-07 ENCOUNTER — Telehealth: Payer: Self-pay

## 2023-04-07 NOTE — Telephone Encounter (Signed)
Attempted f/u call. NO answer, left VM.

## 2023-04-10 ENCOUNTER — Encounter: Payer: Self-pay | Admitting: Internal Medicine

## 2023-04-10 LAB — SURGICAL PATHOLOGY

## 2023-08-04 DIAGNOSIS — M79671 Pain in right foot: Secondary | ICD-10-CM | POA: Diagnosis not present

## 2023-08-04 DIAGNOSIS — I10 Essential (primary) hypertension: Secondary | ICD-10-CM | POA: Diagnosis not present
